# Patient Record
Sex: Male | Born: 2000 | Race: Black or African American | Hispanic: No | Marital: Single | Smoking: Never smoker
Health system: Southern US, Community
[De-identification: ages and names within clinical notes are randomized; demographics above are authoritative.]

## PROBLEM LIST (undated history)

## (undated) DIAGNOSIS — K219 Gastro-esophageal reflux disease without esophagitis: Secondary | ICD-10-CM

## (undated) HISTORY — DX: Gastro-esophageal reflux disease without esophagitis: K21.9

---
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
## 2018-04-18 ENCOUNTER — Emergency Department (HOSPITAL_COMMUNITY)
Admission: EM | Admit: 2018-04-18 | Discharge: 2018-04-18 | Disposition: A | Discharge status: Home / Self Care | Payer: Self-pay | Attending: Emergency Medicine | Admitting: Emergency Medicine

## 2018-04-18 ENCOUNTER — Encounter (HOSPITAL_COMMUNITY): Payer: Self-pay | Admitting: Emergency Medicine

## 2018-04-18 DIAGNOSIS — R369 Urethral discharge, unspecified: Secondary | ICD-10-CM | POA: Insufficient documentation

## 2018-04-18 LAB — URINALYSIS, ROUTINE W REFLEX MICROSCOPIC
BILIRUBIN URINE: NEGATIVE
Glucose, UA: NEGATIVE mg/dL
KETONES UR: NEGATIVE mg/dL
NITRITE: NEGATIVE
PH: 6 (ref 5.0–8.0)
PROTEIN: NEGATIVE mg/dL
Specific Gravity, Urine: 1.016 (ref 1.005–1.030)

## 2018-04-18 MED ORDER — CEFTRIAXONE SODIUM 250 MG IJ SOLR
250.0000 mg | Freq: Once | INTRAMUSCULAR | Status: AC
  Administered 2018-04-18: 250 mg via INTRAMUSCULAR
  Filled 2018-04-18: qty 250

## 2018-04-18 MED ORDER — AZITHROMYCIN 250 MG PO TABS
1000.0000 mg | ORAL_TABLET | Freq: Once | ORAL | Status: AC
  Administered 2018-04-18: 1000 mg via ORAL
  Filled 2018-04-18: qty 4

## 2018-04-18 NOTE — Discharge Instructions (Addendum)
You were treated for gonorrhea and chlamydia today.  The tests for gonorrhea, chlamydia, HIV, and syphilis were sent.  The results should return in several days.  If they are negative, you will not receive a phone call.  If they are positive, you will receive a phone call.  Either way, you should be able to see your results on MyChart. If you are positive for gonorrhea or chlamydia, you do not need further treatment.  If you are positive for HIV or syphilis, you should follow-up with the health department for further management.   If any of your results are positive, you need to inform all recent partners. Do not have unprotected sex for the next 2 weeks, as this can cause reinfection.   Return to the emergency room if you develop fevers, vomiting, abdominal pain, or any new or concerning symptoms.

## 2018-04-18 NOTE — ED Notes (Signed)
ED Provider at bedside. 

## 2018-04-18 NOTE — ED Provider Notes (Signed)
[facility] Provider Note   CSN: [id number] Arrival date & time: 04/18/18  1038     History   Chief Complaint Chief Complaint  Patient presents with  . Exposure to STD    HPI Lawrence Schneider is a 17 y.o. male presenting for evaluation of penile discharge.  Patient states that he developed penile discharge this morning.  Discharge is yellow.  He has some mild dysuria, no hematuria or urinary frequency.  He denies fevers, chills, nausea, vomiting, abdominal pain.  He has had unprotected sex in the past several weeks.  He reports to 5-6 partners over the course of his life, all male.  Additionally, patient has a sore throat that started yesterday.  He denies ear pain, nasal congestion, or cough.  He denies sick contacts.  He states none of his sexual partners have symptoms.  He has never been tested for STDs before.  He has no medical problems, takes no medications daily.  HPI  History reviewed. No pertinent past medical history.  There are no active problems to display for this patient.   History reviewed. No pertinent surgical history.      Home Medications    Prior to Admission medications   Not on File    Family History No family history on file.  Social History Social History   Tobacco Use  . Smoking status: Never Smoker  Substance Use Topics  . Alcohol use: Never    Frequency: Never  . Drug use: Never     Allergies   Patient has no known allergies.   Review of Systems Review of Systems  Constitutional: Negative for fever.  HENT: Positive for sore throat.   Gastrointestinal: Negative for nausea and vomiting.  Genitourinary: Positive for discharge and dysuria. Negative for difficulty urinating, flank pain, frequency, hematuria, penile swelling, scrotal swelling and testicular pain.     Physical Exam Updated Vital Signs BP 123/68 (BP Location: Right Arm)   Pulse 76   Temp 98.8 F (37.1 C) (Oral)   Resp 18    SpO2 100%   Physical Exam  Constitutional: He is oriented to person, place, and time. He appears well-developed and well-nourished. No distress.  Patient is in no distress  HENT:  Head: Normocephalic and atraumatic.  Right Ear: Tympanic membrane, external ear and ear canal normal.  Left Ear: Tympanic membrane, external ear and ear canal normal.  Nose: Nose normal. Right sinus exhibits no maxillary sinus tenderness and no frontal sinus tenderness. Left sinus exhibits no maxillary sinus tenderness and no frontal sinus tenderness.  Mouth/Throat: Uvula is midline and mucous membranes are normal. Posterior oropharyngeal erythema present. No oropharyngeal exudate or posterior oropharyngeal edema. Tonsils are 0 on the right. Tonsils are 0 on the left. No tonsillar exudate.  Mild erythema of OP without tonsillar swelling or exudate.  Uvula midline with equal palate rise.  TMs nonerythematous and nonbulging bilaterally.  No tenderness to palpation of the sinuses.  Eyes: Pupils are equal, round, and reactive to light. Conjunctivae and EOM are normal.  Neck: Normal range of motion.  Cardiovascular: Normal rate, regular rhythm and intact distal pulses.  Pulmonary/Chest: Effort normal and breath sounds normal. He has no decreased breath sounds. He has no wheezes. He has no rhonchi. He has no rales.  Pt speaking in full sentences without difficulty.  Clear lung sounds in all fields  Abdominal: Soft. He exhibits no distension. There is no tenderness. Hernia confirmed negative in the right inguinal area and  confirmed negative in the left inguinal area.  Genitourinary: Testes normal and penis normal. Right testis shows no mass and no tenderness. Left testis shows no mass and no tenderness. Circumcised. No penile tenderness. No discharge found.  Genitourinary Comments: Chaperone present.  No inguinal lymphadenopathy.  No tenderness of the penis or scrotum.  No obvious discharge.  Musculoskeletal: Normal range of  motion.  Lymphadenopathy:    He has no cervical adenopathy. No inguinal adenopathy noted on the right or left side.  Neurological: He is alert and oriented to person, place, and time.  Skin: Skin is warm.  Psychiatric: He has a normal mood and affect.  Nursing note and vitals reviewed.    ED Treatments / Results  Labs (all labs ordered are listed, but only abnormal results are displayed) Labs Reviewed  URINALYSIS, ROUTINE W REFLEX MICROSCOPIC - Abnormal; Notable for the following components:      Result Value   Hgb urine dipstick SMALL (*)    Leukocytes, UA MODERATE (*)    WBC, UA >50 (*)    Bacteria, UA FEW (*)    All other components within normal limits  RPR  HIV ANTIBODY (ROUTINE TESTING)  GC/CHLAMYDIA PROBE AMP ([place]) NOT AT [facility]    EKG None  Radiology No results found.  Procedures Procedures (including critical care time)  Medications Ordered in ED Medications  cefTRIAXone (ROCEPHIN) injection 250 mg (250 mg Intramuscular Given 04/18/18 1207)  azithromycin (ZITHROMAX) tablet 1,000 mg (1,000 mg Oral Given 04/18/18 1206)     Initial Impression / Assessment and Plan / ED Course  I have reviewed the triage vital signs and the nursing notes.  Pertinent labs & imaging results that were available during my care of the patient were reviewed by me and considered in my medical decision making (see chart for details).     Patient presenting for evaluation of sore throat and penile discharge.  Physical exam shows patient who is afebrile not tachycardic.  He appears nontoxic.  OP exam shows mild erythema without signs of strep throat.  No other URI symptoms. Doubt strep, pna, or aom. Likely viral.  Discussed with patient.  Additionally, patient reporting penile discharge.  Physical exam does not show any penile discharge at this time.  Patient is concerned about STDs.  Discussed option of prophylactic treatment, patient elects to get treated.  Gonorrhea, chlamydia, HIV,  and RPR sent.  Discussed importance of follow-up of HIV or RPR is positive.  Patient told to inform all social partners if tests are positive.  At this time, patient appears safe for discharge.  Return precautions given.  Patient states he understands and agrees to plan.  Final Clinical Impressions(s) / ED Diagnoses   Final diagnoses:  Penile discharge    ED Discharge Orders    None       Alveria Apley, PA-C 04/18/18 1407    Mesner, Barbara Cower, MD 04/19/18 1028

## 2018-04-18 NOTE — ED Triage Notes (Signed)
Pt reports concerned for std, c/o flu like symptoms and penile discharge and painful urination since yesterday.

## 2018-04-19 LAB — GC/CHLAMYDIA PROBE AMP (~~LOC~~) NOT AT ARMC
Chlamydia: POSITIVE — AB
Neisseria Gonorrhea: NEGATIVE

## 2018-04-19 LAB — RPR: RPR: NONREACTIVE

## 2018-04-19 LAB — HIV ANTIBODY (ROUTINE TESTING W REFLEX): HIV SCREEN 4TH GENERATION: NONREACTIVE

## 2018-08-08 ENCOUNTER — Emergency Department (HOSPITAL_COMMUNITY)
Admission: EM | Admit: 2018-08-08 | Discharge: 2018-08-08 | Disposition: A | Discharge status: Home / Self Care | Payer: Self-pay | Attending: Emergency Medicine | Admitting: Emergency Medicine

## 2018-08-08 ENCOUNTER — Encounter (HOSPITAL_COMMUNITY): Payer: Self-pay | Admitting: Emergency Medicine

## 2018-08-08 ENCOUNTER — Other Ambulatory Visit: Payer: Self-pay

## 2018-08-08 DIAGNOSIS — N342 Other urethritis: Secondary | ICD-10-CM

## 2018-08-08 LAB — RPR: RPR Ser Ql: NONREACTIVE

## 2018-08-08 LAB — HIV ANTIBODY (ROUTINE TESTING W REFLEX): HIV SCREEN 4TH GENERATION: NONREACTIVE

## 2018-08-08 MED ORDER — AZITHROMYCIN 250 MG PO TABS
1000.0000 mg | ORAL_TABLET | Freq: Once | ORAL | Status: AC
  Administered 2018-08-08: 1000 mg via ORAL
  Filled 2018-08-08: qty 4

## 2018-08-08 MED ORDER — LIDOCAINE HCL (PF) 1 % IJ SOLN
2.0000 mL | Freq: Once | INTRAMUSCULAR | Status: AC
  Administered 2018-08-08: 2 mL
  Filled 2018-08-08: qty 5

## 2018-08-08 MED ORDER — CEFTRIAXONE SODIUM 250 MG IJ SOLR
250.0000 mg | Freq: Once | INTRAMUSCULAR | Status: AC
  Administered 2018-08-08: 250 mg via INTRAMUSCULAR
  Filled 2018-08-08: qty 250

## 2018-08-08 NOTE — Discharge Instructions (Signed)
Please inform all sexual partners to be seen and evaluated at the health department

## 2018-08-08 NOTE — ED Provider Notes (Signed)
[facility] EMERGENCY DEPARTMENT Provider Note   CSN: [id number] Arrival date & time: 08/08/18  0744     History   Chief Complaint Chief Complaint  Patient presents with  . std check    HPI Lawrence Schneider is a 17 y.o. male.  HPI Patient is a 17 year old male with a history of prior x-ray transmitted infection who presents the emergency department with recurrent discharge from his penis after unprotected sex.  He denies other symptoms.  Reports the discharge is white.  Is been present for 48 hours.  No other complaints.  Symptoms are mild in severity.   History reviewed. No pertinent past medical history.  There are no active problems to display for this patient.   History reviewed. No pertinent surgical history.      Home Medications    Prior to Admission medications   Not on File    Family History History reviewed. No pertinent family history.  Social History Social History   Tobacco Use  . Smoking status: Never Smoker  . Smokeless tobacco: Never Used  Substance Use Topics  . Alcohol use: Never    Frequency: Never  . Drug use: Never     Allergies   Patient has no known allergies.   Review of Systems Review of Systems  All other systems reviewed and are negative.    Physical Exam Updated Vital Signs BP 131/81 (BP Location: Right Arm)   Pulse 72   Temp 98.3 F (36.8 C) (Oral)   Resp 18   Ht 5\' 5"  (1.651 m)   Wt 72.6 kg   SpO2 99%   BMI 26.63 kg/m   Physical Exam  Constitutional: He is oriented to person, place, and time. He appears well-developed and well-nourished.  HENT:  Head: Normocephalic.  Eyes: EOM are normal.  Neck: Normal range of motion.  Pulmonary/Chest: Effort normal.  Abdominal: He exhibits no distension.  Genitourinary:  Genitourinary Comments: Circumcised penis.  No penile tenderness or swelling.  Normal-appearing scrotum.  No frank urethral discharge at this time  Musculoskeletal: Normal range of  motion.  Neurological: He is alert and oriented to person, place, and time.  Psychiatric: He has a normal mood and affect.  Nursing note and vitals reviewed.    ED Treatments / Results  Labs (all labs ordered are listed, but only abnormal results are displayed) Labs Reviewed  HIV ANTIBODY (ROUTINE TESTING W REFLEX)  RPR  GC/CHLAMYDIA PROBE AMP ([place]) NOT AT [facility]    EKG None  Radiology No results found.  Procedures Procedures (including critical care time)  Medications Ordered in ED Medications  cefTRIAXone (ROCEPHIN) injection 250 mg (250 mg Intramuscular Given 08/08/18 0911)  azithromycin (ZITHROMAX) tablet 1,000 mg (1,000 mg Oral Given 08/08/18 0911)  lidocaine (PF) (XYLOCAINE) 1 % injection 2 mL (2 mLs Other Given 08/08/18 0911)     Initial Impression / Assessment and Plan / ED Course  I have reviewed the triage vital signs and the nursing notes.  Pertinent labs & imaging results that were available during my care of the patient were reviewed by me and considered in my medical decision making (see chart for details).     Patient be treated for GC chlamydia.  HIV and RPR sent.  Patient informed to let all sexual partners know about the possibility of an STI and to be seen and evaluated at the health department  Final Clinical Impressions(s) / ED Diagnoses   Final diagnoses:  None    ED  Discharge Orders    None       Azalia Bilis, MD 08/08/18 [phone]

## 2018-08-08 NOTE — ED Triage Notes (Signed)
C/o penile discharge started 2 days ago,  -with burning on urination.

## 2018-08-09 LAB — GC/CHLAMYDIA PROBE AMP (~~LOC~~) NOT AT ARMC
CHLAMYDIA, DNA PROBE: NEGATIVE
NEISSERIA GONORRHEA: NEGATIVE

## 2020-08-19 ENCOUNTER — Emergency Department (HOSPITAL_COMMUNITY)
Admission: EM | Admit: 2020-08-19 | Discharge: 2020-08-19 | Disposition: A | Discharge status: Home / Self Care | Payer: Self-pay | Attending: Emergency Medicine | Admitting: Emergency Medicine

## 2020-08-19 ENCOUNTER — Other Ambulatory Visit: Payer: Self-pay

## 2020-08-19 ENCOUNTER — Emergency Department (HOSPITAL_COMMUNITY): Payer: Self-pay

## 2020-08-19 ENCOUNTER — Encounter (HOSPITAL_COMMUNITY): Payer: Self-pay | Admitting: Emergency Medicine

## 2020-08-19 DIAGNOSIS — K219 Gastro-esophageal reflux disease without esophagitis: Secondary | ICD-10-CM | POA: Insufficient documentation

## 2020-08-19 LAB — URINALYSIS, ROUTINE W REFLEX MICROSCOPIC
Bacteria, UA: NONE SEEN
Bilirubin Urine: NEGATIVE
Glucose, UA: NEGATIVE mg/dL
Ketones, ur: NEGATIVE mg/dL
Nitrite: NEGATIVE
Protein, ur: NEGATIVE mg/dL
Specific Gravity, Urine: 1.017 (ref 1.005–1.030)
pH: 6 (ref 5.0–8.0)

## 2020-08-19 LAB — COMPREHENSIVE METABOLIC PANEL
ALT: 16 U/L (ref 0–44)
AST: 23 U/L (ref 15–41)
Albumin: 4.4 g/dL (ref 3.5–5.0)
Alkaline Phosphatase: 59 U/L (ref 38–126)
Anion gap: 12 (ref 5–15)
BUN: 17 mg/dL (ref 6–20)
CO2: 25 mmol/L (ref 22–32)
Calcium: 9.3 mg/dL (ref 8.9–10.3)
Chloride: 100 mmol/L (ref 98–111)
Creatinine, Ser: 1.26 mg/dL — ABNORMAL HIGH (ref 0.61–1.24)
GFR, Estimated: >60 mL/min (ref >60)
Glucose, Bld: 107 mg/dL — ABNORMAL HIGH (ref 70–99)
Potassium: 3.2 mmol/L — ABNORMAL LOW (ref 3.5–5.1)
Sodium: 137 mmol/L (ref 135–145)
Total Bilirubin: 0.6 mg/dL (ref 0.3–1.2)
Total Protein: 7.6 g/dL (ref 6.5–8.1)

## 2020-08-19 LAB — CBC
HCT: 44.9 % (ref 39.0–52.0)
Hemoglobin: 16.2 g/dL (ref 13.0–17.0)
MCH: 31.6 pg (ref 26.0–34.0)
MCHC: 36.1 g/dL — ABNORMAL HIGH (ref 30.0–36.0)
MCV: 87.5 fL (ref 80.0–100.0)
Platelets: 207 10*3/uL (ref 150–400)
RBC: 5.13 MIL/uL (ref 4.22–5.81)
RDW: 11.3 % — ABNORMAL LOW (ref 11.5–15.5)
WBC: 6.8 10*3/uL (ref 4.0–10.5)
nRBC: 0 % (ref 0.0–0.2)

## 2020-08-19 LAB — TROPONIN I (HIGH SENSITIVITY)
Troponin I (High Sensitivity): <2 ng/L (ref <18)
Troponin I (High Sensitivity): <2 ng/L (ref <18)

## 2020-08-19 LAB — LIPASE, BLOOD: Lipase: 24 U/L (ref 11–51)

## 2020-08-19 MED ORDER — OMEPRAZOLE 20 MG PO CPDR: 20.0000 mg | DELAYED_RELEASE_CAPSULE | Freq: Every day | ORAL | 0 refills | Status: DC

## 2020-08-19 MED ORDER — ALUM & MAG HYDROXIDE-SIMETH 200-200-20 MG/5ML PO SUSP
30.0000 mL | Freq: Once | ORAL | Status: DC
  Filled 2020-08-19: qty 30

## 2020-08-19 NOTE — ED Triage Notes (Signed)
Pt reports right sided abdominal/chest pain that started when last night.  Pt indicated that pain gets worse when he eats.

## 2020-08-19 NOTE — ED Notes (Signed)
Patient verbalized understanding of dc instructions, vss, ambulatory with nad.   

## 2020-08-19 NOTE — ED Notes (Signed)
Patient transported to Ultrasound 

## 2020-08-19 NOTE — ED Provider Notes (Signed)
[facility] EMERGENCY DEPARTMENT Provider Note   CSN: [id number] Arrival date & time: 08/19/20  0018     History Chief Complaint  Patient presents with  . Abdominal Pain  . Chest Pain    Lawrence Schneider is a 19 y.o. male.  The history is provided by the patient.  Abdominal Pain Pain location:  Epigastric Pain quality: cramping   Pain radiates to:  Does not radiate Pain severity:  Moderate Onset quality:  Gradual Timing:  Constant Progression:  Unchanged Chronicity:  New Context: eating   Context: not alcohol use   Context comment:  Greasy and spicy food.  Relieved by:  Nothing Worsened by:  Nothing Ineffective treatments:  None tried Associated symptoms: chest pain   Associated symptoms: no fever and no shortness of breath   Risk factors: no alcohol abuse and no aspirin use   Patient with epigastric pain that radiates to the chest with meals.  No f/c/r.  No pleuritic pain. No leg pain no travel.  No exertional symptoms.       History reviewed. No pertinent past medical history.  There are no problems to display for this patient.   History reviewed. No pertinent surgical history.     History reviewed. No pertinent family history.  Social History   Tobacco Use  . Smoking status: Never Smoker  . Smokeless tobacco: Never Used  Vaping Use  . Vaping Use: Every day  Substance Use Topics  . Alcohol use: Never  . Drug use: Never    Home Medications Prior to Admission medications   Medication Sig Start Date End Date Taking? Authorizing Provider  omeprazole (PRILOSEC) 20 MG capsule Take 1 capsule (20 mg total) by mouth daily. 08/19/20   Roshan Roback, MD    Allergies    Patient has no known allergies.  Review of Systems   Review of Systems  Constitutional: Negative for fever.  HENT: Negative for congestion.   Eyes: Negative for visual disturbance.  Respiratory: Negative for shortness of breath.   Cardiovascular: Positive for chest  pain. Negative for palpitations and leg swelling.  Gastrointestinal: Positive for abdominal pain.  Genitourinary: Negative for difficulty urinating.  Musculoskeletal: Negative for arthralgias.  Neurological: Negative for dizziness.  Psychiatric/Behavioral: Negative for agitation.  All other systems reviewed and are negative.   Physical Exam Updated Vital Signs BP 124/87   Pulse 98   Temp 98.2 F (36.8 C) (Oral)   Resp 15   SpO2 99%   Physical Exam Vitals and nursing note reviewed.  Constitutional:      Appearance: Normal appearance. He is not ill-appearing or diaphoretic.  HENT:     Head: Normocephalic and atraumatic.     Nose: Nose normal.  Eyes:     Conjunctiva/sclera: Conjunctivae normal.     Pupils: Pupils are equal, round, and reactive to light.  Cardiovascular:     Rate and Rhythm: Normal rate and regular rhythm.     Pulses: Normal pulses.     Heart sounds: Normal heart sounds.  Pulmonary:     Effort: Pulmonary effort is normal.     Breath sounds: Normal breath sounds.  Abdominal:     General: Abdomen is flat. Bowel sounds are normal.     Palpations: Abdomen is soft.     Tenderness: There is no abdominal tenderness. There is no right CVA tenderness or guarding.  Musculoskeletal:        General: Normal range of motion.     Cervical back: Normal  range of motion and neck supple.  Skin:    General: Skin is warm and dry.     Capillary Refill: Capillary refill takes less than 2 seconds.  Neurological:     General: No focal deficit present.     Mental Status: He is alert and oriented to person, place, and time.     Deep Tendon Reflexes: Reflexes normal.  Psychiatric:        Mood and Affect: Mood normal.        Behavior: Behavior normal.     ED Results / Procedures / Treatments   Labs (all labs ordered are listed, but only abnormal results are displayed) Results for orders placed or performed during the hospital encounter of 08/19/20  Lipase, blood  Result  Value Ref Range   Lipase 24 11 - 51 U/L  Comprehensive metabolic panel  Result Value Ref Range   Sodium 137 135 - 145 mmol/L   Potassium 3.2 (L) 3.5 - 5.1 mmol/L   Chloride 100 98 - 111 mmol/L   CO2 25 22 - 32 mmol/L   Glucose, Bld 107 (H) 70 - 99 mg/dL   BUN 17 6 - 20 mg/dL   Creatinine, Ser 1.611.26 (H) 0.61 - 1.24 mg/dL   Calcium 9.3 8.9 - 09.610.3 mg/dL   Total Protein 7.6 6.5 - 8.1 g/dL   Albumin 4.4 3.5 - 5.0 g/dL   AST 23 15 - 41 U/L   ALT 16 0 - 44 U/L   Alkaline Phosphatase 59 38 - 126 U/L   Total Bilirubin 0.6 0.3 - 1.2 mg/dL   GFR, Estimated >04>60 >54>60 mL/min   Anion gap 12 5 - 15  CBC  Result Value Ref Range   WBC 6.8 4.0 - 10.5 K/uL   RBC 5.13 4.22 - 5.81 MIL/uL   Hemoglobin 16.2 13.0 - 17.0 g/dL   HCT 09.844.9 39 - 52 %   MCV 87.5 80.0 - 100.0 fL   MCH 31.6 26.0 - 34.0 pg   MCHC 36.1 (H) 30.0 - 36.0 g/dL   RDW 11.911.3 (L) 14.711.5 - 82.915.5 %   Platelets 207 150 - 400 K/uL   nRBC 0.0 0.0 - 0.2 %  Urinalysis, Routine w reflex microscopic Urine, Clean Catch  Result Value Ref Range   Color, Urine YELLOW YELLOW   APPearance CLEAR CLEAR   Specific Gravity, Urine 1.017 1.005 - 1.030   pH 6.0 5.0 - 8.0   Glucose, UA NEGATIVE NEGATIVE mg/dL   Hgb urine dipstick SMALL (A) NEGATIVE   Bilirubin Urine NEGATIVE NEGATIVE   Ketones, ur NEGATIVE NEGATIVE mg/dL   Protein, ur NEGATIVE NEGATIVE mg/dL   Nitrite NEGATIVE NEGATIVE   Leukocytes,Ua TRACE (A) NEGATIVE   RBC / HPF 0-5 0 - 5 RBC/hpf   WBC, UA 0-5 0 - 5 WBC/hpf   Bacteria, UA NONE SEEN NONE SEEN   Mucus PRESENT   Troponin I (High Sensitivity)  Result Value Ref Range   Troponin I (High Sensitivity) <2 <18 ng/L  Troponin I (High Sensitivity)  Result Value Ref Range   Troponin I (High Sensitivity) <2 <18 ng/L   DG Chest 2 View  Result Date: 08/19/2020 CLINICAL DATA:  Chest pain. EXAM: CHEST - 2 VIEW COMPARISON:  None. FINDINGS: The heart size and mediastinal contours are within normal limits. Both lungs are clear. The visualized  skeletal structures are unremarkable. IMPRESSION: No active cardiopulmonary disease. Electronically Signed   By: Aram Candelahaddeus  Houston M.D.   On: 08/19/2020 00:52   [place]  Abdomen Limited  Result Date: 08/19/2020 CLINICAL DATA:  Right upper quadrant pain since yesterday. EXAM: ULTRASOUND ABDOMEN LIMITED RIGHT UPPER QUADRANT COMPARISON:  None. FINDINGS: Gallbladder: No gallstones or wall thickening visualized. No sonographic Murphy sign noted by sonographer. Common bile duct: Diameter: 2 mm, normal Liver: No focal lesion identified. Within normal limits in parenchymal echogenicity. Portal vein is patent on color Doppler imaging with normal direction of blood flow towards the liver. Other: None. IMPRESSION: Normal examination. Electronically Signed   By: Burman Nieves M.D.   On: 08/19/2020 04:44  ' EKG EKG Interpretation  Date/Time:  Wednesday August 19 2020 00:19:51 EDT Ventricular Rate:  84 PR Interval:  128 QRS Duration: 76 QT Interval:  358 QTC Calculation: 423 R Axis:   71 Text Interpretation: Normal sinus rhythm with sinus arrhythmia Confirmed by Yasir Kitner ([id number]) on 08/19/2020 4:03:23 AM   Radiology DG Chest 2 View  Result Date: 08/19/2020 CLINICAL DATA:  Chest pain. EXAM: CHEST - 2 VIEW COMPARISON:  None. FINDINGS: The heart size and mediastinal contours are within normal limits. Both lungs are clear. The visualized skeletal structures are unremarkable. IMPRESSION: No active cardiopulmonary disease. Electronically Signed   By: Aram Candela M.D.   On: 08/19/2020 00:52   US Abdomen Limited  Result Date: 08/19/2020 CLINICAL DATA:  Right upper quadrant pain since yesterday. EXAM: ULTRASOUND ABDOMEN LIMITED RIGHT UPPER QUADRANT COMPARISON:  None. FINDINGS: Gallbladder: No gallstones or wall thickening visualized. No sonographic Murphy sign noted by sonographer. Common bile duct: Diameter: 2 mm, normal Liver: No focal lesion identified. Within normal limits in parenchymal  echogenicity. Portal vein is patent on color Doppler imaging with normal direction of blood flow towards the liver. Other: None. IMPRESSION: Normal examination. Electronically Signed   By: Burman Nieves M.D.   On: 08/19/2020 04:44    Procedures Procedures (including critical care time)  Medications Ordered in ED Medications  alum & mag hydroxide-simeth (MAALOX/MYLANTA) 200-200-20 MG/5ML suspension 30 mL (0 mLs Oral Hold 08/19/20 0503)    ED Course  I have reviewed the triage vital signs and the nursing notes.  Pertinent labs & imaging results that were available during my care of the patient were reviewed by me and considered in my medical decision making (see chart for details).    Ruled out for Mi and biliary colic in the ED.  Heart score is 1 very low risk for MACE.  Symptoms consistent with GERD.    Kevron Patella was evaluated in Emergency Department on 08/19/2020 for the symptoms described in the history of present illness. He was evaluated in the context of the global COVID-19 pandemic, which necessitated consideration that the patient might be at risk for infection with the SARS-CoV-2 virus that causes COVID-19. Institutional protocols and algorithms that pertain to the evaluation of patients at risk for COVID-19 are in a state of rapid change based on information released by regulatory bodies including the CDC and federal and state organizations. These policies and algorithms were followed during the patient's care in the ED. \ Final Clinical Impression(s) / ED Diagnoses Final diagnoses:  Gastroesophageal reflux disease without esophagitis   Return for intractable cough, coughing up blood,fevers >100.4 unrelieved by medication, shortness of breath, intractable vomiting, chest pain, shortness of breath, weakness,numbness, changes in speech, facial asymmetry,abdominal pain, passing out,Inability to tolerate liquids or food, cough, altered mental status or any concerns. No signs  of systemic illness or infection. The patient is nontoxic-appearing on exam and vital signs are within normal limits.  I have reviewed the triage vital signs and the nursing notes. Pertinent labs &imaging results that were available during my care of the patient were reviewed by me and considered in my medical decision making (see chart for details).After history, exam, and medical workup I feel the patient has beenappropriately medically screened and is safe for discharge home. Pertinent diagnoses were discussed with the patient. Patient was given return precautions.    Rx / DC Orders ED Discharge Orders         Ordered    omeprazole (PRILOSEC) 20 MG capsule  Daily        08/19/20 0636           Fines Kimberlin, MD 08/19/20 6945

## 2020-09-15 ENCOUNTER — Telehealth: Payer: Medicaid Other | Admitting: Emergency Medicine

## 2020-09-15 DIAGNOSIS — R300 Dysuria: Secondary | ICD-10-CM

## 2020-09-15 NOTE — Progress Notes (Signed)
Based on what you shared with me, I feel your condition warrants further evaluation and I recommend that you be seen for a face to face office visit.  I'm sorry, your symptoms are consistent with "male UTI," but this diagnosis is outside of the scope of practice for an e-visit.  As such, I am not permitted to treat or evaluate you for this condition.  You will need to be seen by your primary care provider or come into the urgent care where formal testing can be performed.  I'm sorry for any inconvenience.  You will not be charged for this visit.   NOTE: If you entered your credit card information for this eVisit, you will not be charged. You may see a "hold" on your card for the $35 but that hold will drop off and you will not have a charge processed.   If you are having a true medical emergency please call 911.      For an urgent face to face visit,  has five urgent care centers for your convenience:     [facility] at [facility] Directions [phone] [address] [place], [place] [address]  10 am - 6pm Monday - Friday    [facility]) Get Driving Directions [phone] [address] [place], [place] [address]  10 am to 8 pm Monday-Friday  12 pm to 8 pm [facility] at [facility] Get Driving Directions [phone] [address], [address] [place], [place] [address]  8 am to 8 pm Monday-Friday  9 am to 6 pm Saturday  11 am to 6 pm Sunday     [facility] at [facility] Get Driving Directions  [phone] [address] [place], [place] [address]  8 am to 8 pm Monday-Friday  8 am to 4 pm [facility] Urgent Care at [facility] Directions [phone] [address] [place], [place] [address]  12 pm to 6 pm Monday-Friday      Your e-visit answers were reviewed by a  board certified advanced clinical practitioner to complete your personal care plan.  Thank you for using e-Visits.    Approximately 5 minutes was used in reviewing the patient's chart, questionnaire, prescribing medications, and documentation.

## 2020-10-04 ENCOUNTER — Other Ambulatory Visit: Payer: Self-pay

## 2020-10-04 ENCOUNTER — Emergency Department
Admission: RE | Admit: 2020-10-04 | Discharge: 2020-10-04 | Disposition: A | Discharge status: Home / Self Care | Payer: Self-pay | Source: Ambulatory Visit

## 2020-10-04 VITALS — BP 129/72 | HR 60 | Temp 98.2°F | Resp 14

## 2020-10-04 DIAGNOSIS — N342 Other urethritis: Secondary | ICD-10-CM

## 2020-10-04 DIAGNOSIS — Z113 Encounter for screening for infections with a predominantly sexual mode of transmission: Secondary | ICD-10-CM

## 2020-10-04 LAB — POCT URINALYSIS DIP (MANUAL ENTRY)
Bilirubin, UA: NEGATIVE
Glucose, UA: NEGATIVE mg/dL
Ketones, POC UA: NEGATIVE mg/dL
Nitrite, UA: NEGATIVE
Protein Ur, POC: NEGATIVE mg/dL
Spec Grav, UA: 1.025 (ref 1.010–1.025)
Urobilinogen, UA: 0.2 E.U./dL
pH, UA: 5.5 (ref 5.0–8.0)

## 2020-10-04 MED ORDER — CEFTRIAXONE SODIUM 500 MG IJ SOLR
500.0000 mg | Freq: Once | INTRAMUSCULAR | Status: AC
  Administered 2020-10-04: 500 mg via INTRAMUSCULAR

## 2020-10-04 NOTE — ED Triage Notes (Signed)
Patient presents to Urgent Care with complaints of burning and tingling w/ urination since about 10 days ago. Patient reports he has been having unprotected sex, unknown exposure to STDs.

## 2020-10-04 NOTE — ED Provider Notes (Signed)
[facility]    CSN: [id number] Arrival date & time: 10/04/20  7494      History   Chief Complaint Chief Complaint  Patient presents with  . Appointment    0900  . Dysuria    HPI Lawrence Schneider is a 19 y.o. male.   HPI  Patient here today for STD testing.  Patient endorses burning and tingling with urination x10 days ago.  He denies any penile discharge.  He has been actively having sexual intercourse without barrier protection.  He denies any knowledge of any known STD exposures.  Denies any other symptoms or concerns.  History reviewed. No pertinent past medical history.  There are no problems to display for this patient.   History reviewed. No pertinent surgical history.     Home Medications    Prior to Admission medications   Medication Sig Start Date End Date Taking? Authorizing Provider  omeprazole (PRILOSEC) 20 MG capsule Take 1 capsule (20 mg total) by mouth daily. 08/19/20   Palumbo, April, MD    Family History Family History  Problem Relation Age of Onset  . Hypertension Mother   . Healthy Father     Social History Social History   Tobacco Use  . Smoking status: Never Smoker  . Smokeless tobacco: Never Used  Vaping Use  . Vaping Use: Every day  Substance Use Topics  . Alcohol use: Yes    Comment: from time to ttime  . Drug use: Never     Allergies   Patient has no known allergies.   Review of Systems Review of Systems Pertinent negatives listed in HPI  Physical Exam Triage Vital Signs ED Triage Vitals  Enc Vitals Group     BP 10/04/20 0909 129/72     Pulse Rate 10/04/20 0909 60     Resp 10/04/20 0909 14     Temp 10/04/20 0909 98.2 F (36.8 C)     Temp Source 10/04/20 0909 Oral     SpO2 10/04/20 0909 100 %     Weight --      Height --      Head Circumference --      Peak Flow --      Pain Score 10/04/20 0907 3     Pain Loc --      Pain Edu? --      Excl. in GC? --    No data found.  Updated Vital Signs BP  129/72 (BP Location: Right Arm)   Pulse 60   Temp 98.2 F (36.8 C) (Oral)   Resp 14   SpO2 100%   Visual Acuity Right Eye Distance:   Left Eye Distance:   Bilateral Distance:    Right Eye Near:   Left Eye Near:    Bilateral Near:     Physical Exam General appearance: alert, well developed, well nourished, cooperative and in no distress Head: Normocephalic, without obvious abnormality, atraumatic Respiratory: Respirations even and unlabored, normal respiratory rate Heart: rate and rhythm normal. No gallop or murmurs noted on exam  Abdomen: BS +, no distention, no rebound tenderness, or no mass Extremities: No gross deformities Skin: Skin color, texture, turgor normal. No rashes seen  Psych: Appropriate mood and affect. Specimen self collected GU exam deferred  UC Treatments / Results  Labs (all labs ordered are listed, but only abnormal results are displayed) Labs Reviewed  POCT URINALYSIS DIP (MANUAL ENTRY) - Abnormal; Notable for the following components:      Result Value  Clarity, UA cloudy (*)    Blood, UA trace-intact (*)    Leukocytes, UA Small (1+) (*)    All other components within normal limits  C. TRACHOMATIS/N. GONORRHOEAE RNA  HIV ANTIBODY (ROUTINE TESTING W REFLEX)  RPR    EKG   Radiology No results found.  Procedures Procedures (including critical care time)  Medications Ordered in UC Medications - No data to display  Initial Impression / Assessment and Plan / UC Course  I have reviewed the triage vital signs and the nursing notes.  Pertinent labs & imaging results that were available during my care of the patient were reviewed by me and considered in my medical decision making (see chart for details).    STD concern, will treat with Rocephin 500 mg given dysuria symptoms this is likely related urethritis secondary to a possible STD.  Will wait for results before treatin with any additional oral medications. Final Clinical Impressions(s) /  UC Diagnoses   Final diagnoses:  Screening for STD (sexually transmitted disease)  Urethritis     Discharge Instructions     Your STD results will be available within the next 3 to 4 days and will be uploaded to MyChart.  In the event you require further treatment our office will notify you via MyChart.    ED Prescriptions    None     PDMP not reviewed this encounter.   Bing Neighbors, FNP 10/04/20 0930

## 2020-10-04 NOTE — Discharge Instructions (Signed)
Your STD results will be available within the next 3 to 4 days and will be uploaded to MyChart.  In the event you require further treatment our office will notify you via MyChart.

## 2020-10-06 ENCOUNTER — Telehealth (HOSPITAL_COMMUNITY): Payer: Self-pay | Admitting: Emergency Medicine

## 2020-10-06 LAB — RPR: RPR Ser Ql: NONREACTIVE

## 2020-10-06 LAB — C. TRACHOMATIS/N. GONORRHOEAE RNA
C. trachomatis RNA, TMA: DETECTED — AB
N. gonorrhoeae RNA, TMA: NOT DETECTED

## 2020-10-06 LAB — HIV ANTIBODY (ROUTINE TESTING W REFLEX): HIV 1&2 Ab, 4th Generation: NONREACTIVE

## 2020-10-06 MED ORDER — DOXYCYCLINE HYCLATE 100 MG PO CAPS: 100.0000 mg | ORAL_CAPSULE | Freq: Two times a day (BID) | ORAL | 0 refills | Status: DC

## 2020-10-08 ENCOUNTER — Telehealth: Payer: Self-pay | Admitting: Emergency Medicine

## 2020-10-08 NOTE — Telephone Encounter (Signed)
Call from [facility] regarding dose times for antibiotic - pt says he is supposed to take them every 12 hours, but he works 2nd shift and is not sure when to take the antibiotic. Snyder states he took antibiotic prior to calling [facility]. Pt instructed by RN to take medicine as prescribed for it to be effective. He had approx 1 hour on either side of the time dose was due to take the antibiotic. Pt asked about drinking alcohol while on antibiotic- pt instructed to not drink any alcohol while on antibiotics- pt verbalized an understnading

## 2020-11-25 ENCOUNTER — Encounter: Payer: Self-pay | Admitting: Nurse Practitioner

## 2020-11-25 ENCOUNTER — Other Ambulatory Visit: Payer: Self-pay

## 2020-11-25 ENCOUNTER — Ambulatory Visit (INDEPENDENT_AMBULATORY_CARE_PROVIDER_SITE_OTHER): Payer: Self-pay | Admitting: Nurse Practitioner

## 2020-11-25 VITALS — BP 118/76 | HR 60 | Temp 97.6°F | Ht 65.0 in | Wt 142.2 lb

## 2020-11-25 DIAGNOSIS — K219 Gastro-esophageal reflux disease without esophagitis: Secondary | ICD-10-CM

## 2020-11-25 DIAGNOSIS — Z1159 Encounter for screening for other viral diseases: Secondary | ICD-10-CM

## 2020-11-25 DIAGNOSIS — Z8619 Personal history of other infectious and parasitic diseases: Secondary | ICD-10-CM

## 2020-11-25 DIAGNOSIS — Z7689 Persons encountering health services in other specified circumstances: Secondary | ICD-10-CM

## 2020-11-25 MED ORDER — OMEPRAZOLE 20 MG PO CPDR: 20.0000 mg | DELAYED_RELEASE_CAPSULE | Freq: Every day | ORAL | 2 refills | Status: DC

## 2020-11-25 NOTE — Patient Instructions (Signed)
Healthy Eating Following a healthy eating pattern may help you to achieve and maintain a healthy body weight, reduce the risk of chronic disease, and live a long and productive life. It is important to follow a healthy eating pattern at an appropriate calorie level for your body. Your nutritional needs should be met primarily through food by choosing a variety of nutrient-rich foods. What are tips for following this plan? Reading food labels  Read labels and choose the following: ? Reduced or low sodium. ? Juices with 100% fruit juice. ? Foods with low saturated fats and high polyunsaturated and monounsaturated fats. ? Foods with whole grains, such as whole wheat, cracked wheat, brown rice, and wild rice. ? Whole grains that are fortified with folic acid. This is recommended for women who are pregnant or who want to become pregnant.  Read labels and avoid the following: ? Foods with a lot of added sugars. These include foods that contain brown sugar, corn sweetener, corn syrup, dextrose, fructose, glucose, high-fructose corn syrup, honey, invert sugar, lactose, malt syrup, maltose, molasses, raw sugar, sucrose, trehalose, or turbinado sugar.  Do not eat more than the following amounts of added sugar per day:  6 teaspoons (25 g) for women.  9 teaspoons (38 g) for men. ? Foods that contain processed or refined starches and grains. ? Refined grain products, such as white flour, degermed cornmeal, white bread, and white rice. Shopping  Choose nutrient-rich snacks, such as vegetables, whole fruits, and nuts. Avoid high-calorie and high-sugar snacks, such as potato chips, fruit snacks, and candy.  Use oil-based dressings and spreads on foods instead of solid fats such as butter, stick margarine, or cream cheese.  Limit pre-made sauces, mixes, and "instant" products such as flavored rice, instant noodles, and ready-made pasta.  Try more plant-protein sources, such as tofu, tempeh, black beans,  edamame, lentils, nuts, and seeds.  Explore eating plans such as the Mediterranean diet or vegetarian diet. Cooking  Use oil to saut or stir-fry foods instead of solid fats such as butter, stick margarine, or lard.  Try baking, boiling, grilling, or broiling instead of frying.  Remove the fatty part of meats before cooking.  Steam vegetables in water or broth. Meal planning  At meals, imagine dividing your plate into fourths: ? One-half of your plate is fruits and vegetables. ? One-fourth of your plate is whole grains. ? One-fourth of your plate is protein, especially lean meats, poultry, eggs, tofu, beans, or nuts.  Include low-fat dairy as part of your daily diet.   Lifestyle  Choose healthy options in all settings, including home, work, school, restaurants, or stores.  Prepare your food safely: ? Wash your hands after handling raw meats. ? Keep food preparation surfaces clean by regularly washing with hot, soapy water. ? Keep raw meats separate from ready-to-eat foods, such as fruits and vegetables. ? Cook seafood, meat, poultry, and eggs to the recommended internal temperature. ? Store foods at safe temperatures. In general:  Keep cold foods at 7F (4.4C) or below.  Keep hot foods at 17F (60C) or above.  Keep your freezer at [facility] (-17.8C) or below.  Foods are no longer safe to eat when they have been between the temperatures of 40-17F (4.4-60C) for more than 2 hours. What foods should I eat? Fruits Aim to eat 2 cup-equivalents of fresh, canned (in natural juice), or frozen fruits each day. Examples of 1 cup-equivalent of fruit include 1 small apple, 8 large strawberries, 1 cup canned fruit,  cup dried fruit, or 1 cup 100% juice. Vegetables Aim to eat 2-3 cup-equivalents of fresh and frozen vegetables each day, including different varieties and colors. Examples of 1 cup-equivalent of vegetables include 2 medium carrots, 2 cups raw, leafy greens, 1 cup chopped  vegetable (raw or cooked), or 1 medium baked potato. Grains Aim to eat 6 ounce-equivalents of whole grains each day. Examples of 1 ounce-equivalent of grains include 1 slice of bread, 1 cup ready-to-eat cereal, 3 cups popcorn, or  cup cooked rice, pasta, or cereal. Meats and other proteins Aim to eat 5-6 ounce-equivalents of protein each day. Examples of 1 ounce-equivalent of protein include 1 egg, 1/2 cup nuts or seeds, or 1 tablespoon (16 g) peanut butter. A cut of meat or fish that is the size of a deck of cards is about 3-4 ounce-equivalents.  Of the protein you eat each week, try to have at least 8 ounces come from seafood. This includes salmon, trout, herring, and anchovies. Dairy Aim to eat 3 cup-equivalents of fat-free or low-fat dairy each day. Examples of 1 cup-equivalent of dairy include 1 cup (240 mL) milk, 8 ounces (250 g) yogurt, 1 ounces (44 g) natural cheese, or 1 cup (240 mL) fortified soy milk. Fats and oils  Aim for about 5 teaspoons (21 g) per day. Choose monounsaturated fats, such as canola and olive oils, avocados, peanut butter, and most nuts, or polyunsaturated fats, such as sunflower, corn, and soybean oils, walnuts, pine nuts, sesame seeds, sunflower seeds, and flaxseed. Beverages  Aim for six 8-oz glasses of water per day. Limit coffee to three to five 8-oz cups per day.  Limit caffeinated beverages that have added calories, such as soda and energy drinks.  Limit alcohol intake to no more than 1 drink a day for nonpregnant women and 2 drinks a day for men. One drink equals 12 oz of beer (355 mL), 5 oz of wine (148 mL), or 1 oz of hard liquor (44 mL). Seasoning and other foods  Avoid adding excess amounts of salt to your foods. Try flavoring foods with herbs and spices instead of salt.  Avoid adding sugar to foods.  Try using oil-based dressings, sauces, and spreads instead of solid fats. This information is based on general [place] nutrition guidelines. For more  information, visit [employer]. Exact amounts may vary based on your nutrition needs. Summary  A healthy eating plan may help you to maintain a healthy weight, reduce the risk of chronic diseases, and stay active throughout your life.  Plan your meals. Make sure you eat the right portions of a variety of nutrient-rich foods.  Try baking, boiling, grilling, or broiling instead of frying.  Choose healthy options in all settings, including home, work, school, restaurants, or stores. This information is not intended to replace advice given to you by your health care provider. Make sure you discuss any questions you have with your health care provider. Document Revised: 01/29/2018 Document Reviewed: 01/29/2018 Elsevier Patient Education  2021 [place] that are in packages or containers have a Nutrition Facts panel on the side or back. This is commonly called the food label. The food label helps you make informed food choices by providing information about serving size and the amount of calories and various nutrients in the food. You can check the food label to find out if the food contains high or low amounts of items that you want to limit in your diet. You can  also use the food label to see if the food is a good source of the nutrients that you want to include in your diet. How do I read the food label?  Start by looking at the serving size and servings per package.  Check the calories.  Check the amount of fat, cholesterol, and sodium. Try to limit these nutrients.  Check the amount of dietary fiber, protein, and other vitamins and minerals listed. Depending on recommendations from your health care provider or dietitian, certain values may be more important to your overall health and diet than others.  Check the added sugar. This is sugar that was added in the making of the food or drink. This number does not include sugar that naturally occurs in  foods such as milk, fruits, and vegetables. Try to limit added sugar. Children aged 2-18 years and women should limit added sugar to 6 teaspoons (25 g) a day. Men should limit added sugar to 9 teaspoons (36 g) a day.  Look at the ingredient list. Depending on your dietary needs, you may need to avoid foods with certain ingredients. Talk to your health care provider or dietitian about what ingredients you should watch for.   What does the information on the food label mean? Serving size  This indicates the amount of the food that makes up one serving. All of the nutrition information listed on the food label is based on one serving.  Serving size may be based on: ? The number of food pieces. ? The volume of food (cups, fluid ounces, tablespoons, milliliters). ? The weight of food (grams, ounces).  The label will also indicate how many servings are in one package. If you eat more than one serving, you must multiply the amounts (such as calories, grams of saturated fat, or milligrams of sodium) by the number of servings. Calories  Calories are a measure of the amount of energy that your body gets from the food.  Most food labels list only the calories in one serving of food. Some foods may list the number of calories per package if one package contains slightly more than one serving.  Counting total daily calories is one way that is used to help manage weight.  Talk to your health care provider or dietitian about how many calories you should eat each day. Percent daily value  Percent Daily Value (%DV) tells you what percent of the daily value for each nutrient one serving provides. The daily value is the recommended total amount of the item that you should get each day. For example, if 15% is listed next to dietary fiber, it means that one serving of the food will give you 15% of the recommended amount of fiber that you should get in a day. The daily values are based on a diet of 2,000 calories  a day. You may get more or less than 2,000 calories in your diet each day, but the %DV gives you an idea of whether the food contains a high or low amount of the listed item. ? 5% DV or less means there is a low amount of a nutrient in one serving. ? 20% DV or higher means there is a high amount of a nutrient in one serving. Total fat  Total fat shows you the number of grams (g) of fat in one serving. Two of the fats that make up a portion of the total fat are included on the label: ? Saturated fat. The food label shows  both the amount of fat in grams (g) and the percent Daily Value per serving. This type of fat increases the amount of cholesterol in your blood. If you eat 2,000 calories each day, you should eat less than 13 g of saturated fat each day. ? Trans fat. The food label shows the number of grams (g) per serving. This type of fat is the most unhealthy fat for heart health. It is recommended that people limit their intake of trans fat to as little as possible. Look for foods that have "0 g Trans Fat" on the label. Cholesterol  Cholesterol tells you the number of milligrams (mg) and the percent Daily Value of cholesterol in one serving. Cholesterol is a fat-like substance. It can be harmful if you eat too much of it. Sodium  Sodium tells you the number of milligrams (mg) and the percent Daily Value of sodium in one serving. If eaten in large amounts, sodium can raise your blood pressure. Most people should limit their sodium intake to 2,300 mg a day. Total carbohydrate  Total carbohydrate shows you the number of grams (g) of carbohydrates in one serving. Two types of carbohydrates make up the total carbohydrates included on the label: ? Dietary fiber. The food label shows both the amount of dietary fiber in grams (g) and the percent Daily Value per serving. Most adults should eat at least 25 g of dietary fiber each day. ? Total sugars. The food label shows the number of grams (g) of sugars  per serving. This value includes both naturally occurring sugars, such as those in fruit and milk, and added sugars, such as honey or table sugar.  Added sugars. This value is the amount of added sugar. It is part of the total sugar count in the food or drink. Protein  Protein tells you how many grams (g) of protein are in one serving. The recommended amount of daily protein differs for men and women, and it may depend on your overall health. Talk to your health care provider or dietitian about how much protein you should eat each day. Vitamins and minerals  The food label shows the percent Daily Value for certain vitamins and minerals, including vitamin D, calcium, potassium, and iron. Other vitamins and minerals may be listed depending on the food. Ingredients  Food labels list each ingredient in the food. The ingredients are listed in the order of their amount by weight from most to least.  Food labels may also include a warning about ingredients that can cause allergic reactions in some people. These may be indicated by the words "Contains" or "May contain." Examples of ingredients that may be listed are wheat, dairy, eggs, soy, and nuts. If a person knows that he or she is allergic to one of these ingredients, he or she will know to avoid that food. Where to find more information  U.S. Food and Drug Administration: [employer] Summary  The food label is the common term for the Nutrition Facts panel on the side or back of food packages or containers.  The food label helps you make informed food choices by providing information about serving size and the amount of calories and various nutrients in the food.  To read the food label, begin by checking the serving size and number of servings in the container. Then check the calories and the amount of each listed item. This information is not intended to replace advice given to you by your health care provider. Make sure you  discuss any  questions you have with your health care provider. Document Revised: 01/01/2020 Document Reviewed: 01/01/2020 Elsevier Patient Education  [place].

## 2020-11-25 NOTE — Progress Notes (Unsigned)
I,Yamilka Roman [employer] as a Neurosurgeon for [employer], FNP.,have documented all relevant documentation on the behalf of Arnette Felts, FNP,as directed by  Arnette Felts, FNP while in the presence of Arnette Felts, FNP. This visit occurred during the SARS-CoV-2 public health emergency.  Safety protocols were in place, including screening questions prior to the visit, additional usage of staff PPE, and extensive cleaning of exam room while observing appropriate contact time as indicated for disinfecting solutions.  Subjective:     Patient ID: Lawrence Schneider , male    DOB: [birth year] , 20 y.o.   MRN: [id number]   Chief Complaint  Patient presents with  . Establish Care  . Heartburn    Patient stated after he eats a lot he notices that he gets some chest pain.he stated when he eats spicy foods it triggers the chest pain as well.     HPI  Here to establish care, had been seeing a provider in [place] several years ago.  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  Single.  No children.  Completed 11th grade.  He is planning to go back to get his diploma.    PMH - GERD - feels like generally when he overeats.  Hx - unsure which one but thinks Chlamydia or Gonorrhea. He did take medications for chlamydia he had a discharge but this has improved.   [facility] - mother hypertension,     Past Medical History:  Diagnosis Date  . GERD (gastroesophageal reflux disease)      Family History  Problem Relation Age of Onset  . Hypertension Mother   . Healthy Father   . Hypertension Maternal Grandmother   . Healthy Paternal Grandmother   . Healthy Paternal Grandfather      Current Outpatient Medications:  .  omeprazole (PRILOSEC) 20 MG capsule, Take 1 capsule (20 mg total) by mouth daily., Disp: 30 capsule, Rfl: 2   No Known Allergies   Review of Systems  Constitutional: Negative.   HENT: Negative.   Eyes: Negative.   Respiratory: Negative.   Cardiovascular: Negative.    Gastrointestinal: Negative.   Endocrine: Negative.   Genitourinary: Negative.   Musculoskeletal: Negative.   Skin: Negative.   Neurological: Negative.   Hematological: Negative.   Psychiatric/Behavioral: Negative.      Today's Vitals   11/25/20 0956  BP: 118/76  Pulse: 60  Temp: 97.6 F (36.4 C)  TempSrc: Oral  SpO2: (!) 65%  Weight: 142 lb 3.2 oz (64.5 kg)  Height: 5\' 5"  (1.651 m)  PainSc: 0-No pain   Body mass index is 23.66 kg/m.   Objective:  Physical Exam Vitals reviewed.  Constitutional:      General: He is not in acute distress.    Appearance: Normal appearance.  Cardiovascular:     Rate and Rhythm: Normal rate and regular rhythm.     Pulses: Normal pulses.     Heart sounds: Normal heart sounds. No murmur heard.   Pulmonary:     Effort: Pulmonary effort is normal. No respiratory distress.     Breath sounds: Normal breath sounds.  Neurological:     General: No focal deficit present.     Mental Status: He is alert and oriented to person, place, and time.  Psychiatric:        Mood and Affect: Mood normal.        Behavior: Behavior normal.        Thought Content: Thought content normal.  Judgment: Judgment normal.         Assessment And Plan:     1. Gastroesophageal reflux disease without esophagitis  Will try him on omeprazole,   He is encouraged to avoid fried and fatty, spicy foods  Will follow up in 4-6 weeks - omeprazole (PRILOSEC) 20 MG capsule; Take 1 capsule (20 mg total) by mouth daily.  Dispense: 30 capsule; Refill: 2  2. History of chlamydia  3. Establishing care with new doctor, encounter for  4. Encounter for hepatitis C screening test for low risk patient  Will check Hepatitis C screening due to recent recommendations to screen all adults 18 years and older - Hepatitis C antibody     Patient was given opportunity to ask questions. Patient verbalized understanding of the plan and was able to repeat key elements of the  plan. All questions were answered to their satisfaction.  Arnette Felts, FNP   I, Arnette Felts, FNP, have reviewed all documentation for this visit. The documentation on 11/25/20 for the exam, diagnosis, procedures, and orders are all accurate and complete.   THE PATIENT IS ENCOURAGED TO PRACTICE SOCIAL DISTANCING DUE TO THE COVID-19 PANDEMIC.

## 2020-11-26 LAB — HEPATITIS C ANTIBODY: Hep C Virus Ab: <0.1 s/co ratio (ref 0.0–0.9)

## 2020-12-21 ENCOUNTER — Encounter: Payer: Self-pay | Admitting: Nurse Practitioner

## 2021-02-17 ENCOUNTER — Emergency Department (HOSPITAL_COMMUNITY)
Admission: EM | Admit: 2021-02-17 | Discharge: 2021-02-17 | Disposition: A | Discharge status: Home / Self Care | Payer: BC Managed Care – PPO | Attending: Emergency Medicine | Admitting: Emergency Medicine

## 2021-02-17 ENCOUNTER — Emergency Department (HOSPITAL_COMMUNITY): Payer: BC Managed Care – PPO

## 2021-02-17 ENCOUNTER — Other Ambulatory Visit: Payer: Self-pay

## 2021-02-17 ENCOUNTER — Encounter (HOSPITAL_COMMUNITY): Payer: Self-pay | Admitting: Emergency Medicine

## 2021-02-17 ENCOUNTER — Ambulatory Visit: Payer: Medicaid Other | Admitting: Nurse Practitioner

## 2021-02-17 DIAGNOSIS — M25531 Pain in right wrist: Secondary | ICD-10-CM | POA: Insufficient documentation

## 2021-02-17 MED ORDER — IBUPROFEN 400 MG PO TABS
600.0000 mg | ORAL_TABLET | Freq: Once | ORAL | Status: AC
  Administered 2021-02-17: 600 mg via ORAL
  Filled 2021-02-17: qty 1

## 2021-02-17 NOTE — Progress Notes (Signed)
Orthopedic Tech Progress Note Patient Details:  Lawrence Schneider 2001-09-28 [id number]  Ortho Devices Type of Ortho Device: Velcro wrist splint Ortho Device/Splint Location: RUE Ortho Device/Splint Interventions: Ordered,Application   Post Interventions Patient Tolerated: Well Instructions Provided: Care of device   [name] 02/17/2021, 4:37 PM

## 2021-02-17 NOTE — ED Triage Notes (Signed)
Pt here with right wrist pain , no trauma noted

## 2021-02-17 NOTE — ED Provider Notes (Signed)
[facility] EMERGENCY DEPARTMENT Provider Note   CSN: [id number] Arrival date & time: 02/17/21  1424     History No chief complaint on file.   Lawrence Schneider is a 20 y.o. male.  HPI He presents for evaluation of right wrist pain, present for a month, possibly related to lifting weights.  No specific known trauma.  No prior similar problems.  There are no other known active modifying factors    Past Medical History:  Diagnosis Date  . GERD (gastroesophageal reflux disease)     Patient Active Problem List   Diagnosis Date Noted  . Gastroesophageal reflux disease without esophagitis 11/25/2020    History reviewed. No pertinent surgical history.     Family History  Problem Relation Age of Onset  . Hypertension Mother   . Healthy Father   . Hypertension Maternal Grandmother   . Healthy Paternal Grandmother   . Healthy Paternal Grandfather     Social History   Tobacco Use  . Smoking status: Never Smoker  . Smokeless tobacco: Never Used  Vaping Use  . Vaping Use: Every day  Substance Use Topics  . Alcohol use: Yes    Comment: from time to ttime  . Drug use: Never    Home Medications Prior to Admission medications   Medication Sig Start Date End Date Taking? Authorizing Provider  omeprazole (PRILOSEC) 20 MG capsule Take 1 capsule (20 mg total) by mouth daily. 11/25/20   Arnette Felts, FNP    Allergies    Patient has no known allergies.  Review of Systems   Review of Systems  All other systems reviewed and are negative.   Physical Exam Updated Vital Signs BP 115/72 (BP Location: Right Arm)   Pulse 73   Temp 98.9 F (37.2 C)   Resp 16   SpO2 99%   Physical Exam Vitals and nursing note reviewed.  Constitutional:      Appearance: He is well-developed.  HENT:     Head: Normocephalic and atraumatic.     Right Ear: External ear normal.     Left Ear: External ear normal.  Eyes:     Conjunctiva/sclera: Conjunctivae normal.  Neck:      Trachea: Phonation normal.  Cardiovascular:     Rate and Rhythm: Normal rate.  Pulmonary:     Effort: Pulmonary effort is normal.  Musculoskeletal:        General: Normal range of motion.     Cervical back: Normal range of motion and neck supple.     Comments: Right wrist possibly slightly swollen.  It is tender in the area of the medial wrist.  There is no limitation of motion.  There is no deformity.  Skin:    General: Skin is warm and dry.  Neurological:     Mental Status: He is alert and oriented to person, place, and time.     Cranial Nerves: No cranial nerve deficit.     Sensory: No sensory deficit.     Motor: No abnormal muscle tone.     Coordination: Coordination normal.  Psychiatric:        Mood and Affect: Mood normal.        Behavior: Behavior normal.        Thought Content: Thought content normal.        Judgment: Judgment normal.     ED Results / Procedures / Treatments   Labs (all labs ordered are listed, but only abnormal results are displayed) Labs Reviewed -  No data to display  EKG None  Radiology DG Wrist Complete Right  Result Date: 02/17/2021 CLINICAL DATA:  20 year old male with right wrist pain. EXAM: RIGHT WRIST - COMPLETE 3+ VIEW COMPARISON:  None. FINDINGS: There is no evidence of fracture or dislocation. There is no evidence of arthropathy or other focal bone abnormality. Soft tissues are unremarkable. IMPRESSION: Negative. Electronically Signed   By: Elgie Collard M.D.   On: 02/17/2021 15:31    Procedures Procedures   Medications Ordered in ED Medications  ibuprofen (ADVIL) tablet 600 mg (600 mg Oral Given 02/17/21 1530)    ED Course  I have reviewed the triage vital signs and the nursing notes.  Pertinent labs & imaging results that were available during my care of the patient were reviewed by me and considered in my medical decision making (see chart for details).    MDM Rules/Calculators/A&P                           Patient  Vitals for the past 24 hrs:  BP Temp Pulse Resp SpO2  02/17/21 1436 115/72 98.9 F (37.2 C) 73 16 99 %    3:39 PM Reevaluation with update and discussion. After initial assessment and treatment, an updated evaluation reveals he is comfortable has no further complaints.  Findings discussed and questions answered. Mancel Bale   Medical Decision Making:  This patient is presenting for evaluation of right wrist pain, which does not require a range of treatment options, and is not a complaint that involves a high risk of morbidity and mortality. The differential diagnoses include sprain, fracture, tendinitis. I decided to review old records, and in summary Young male with subacute pain right wrist, he does a lot of lifting.  I did not require additional historical information from anyone.  Radiologic Tests Ordered, included right wrist x-ray.  I independently Visualized: Radiograph images, which show no acute abnormality   Critical Interventions-clinical evaluation, radiography, observation and reassessment  After These Interventions, the Patient was reevaluated and was found stable for discharge.  Doubt fracture, tendinitis, arthritis.  Pain is nonspecific and can be treated symptomatically.  Patient desired a wrist splint for comfort.  CRITICAL CARE-no Performed by: Mancel Bale  Nursing Notes Reviewed/ Care Coordinated Applicable Imaging Reviewed Interpretation of Laboratory Data incorporated into ED treatment  The patient appears reasonably screened and/or stabilized for discharge and I doubt any other medical condition or other [facility] requiring further screening, evaluation, or treatment in the ED at this time prior to discharge.  Plan: Home Medications-ibuprofen as needed; Home Treatments-resplinted as needed, alter strength training activities; return here if the recommended treatment, does not improve the symptoms; Recommended follow up-PCP, PRN     Final Clinical Impression(s)  / ED Diagnoses Final diagnoses:  Right wrist pain    Rx / DC Orders ED Discharge Orders    None       Mancel Bale, MD 02/17/21 1546

## 2021-02-17 NOTE — ED Notes (Signed)
Patient transported to X-ray 

## 2021-02-17 NOTE — ED Triage Notes (Signed)
Emergency Medicine Provider Triage Evaluation Note  Lawrence Schneider , a 20 y.o. male  was evaluated in triage.  Pt complains of right wrist pain.  Pain has been present and worsening over the past month.  He denies any known injury or trauma.  Does report that he works out a lot in [employer] and his prior job he was doing involves a lot of heavy lifting he does not know if he could have injured it that way.  No swelling or redness.  Pain worse with movement.  Has not used a brace or any medications to treat symptoms prior to arrival.  Review of Systems  Positive: Wrist pain Negative: Fever, swelling  Physical Exam  BP 115/72 (BP Location: Right Arm)   Pulse 73   Temp 98.9 F (37.2 C)   Resp 16   SpO2 99%  Gen:   Awake, no distress   HEENT:  Atraumatic  Resp:  Normal effort  Cardiac:  Normal rate  Abd:   Nondistended MSK:   Moves extremities without difficulty, tenderness over right wrist without swelling or deformity, no redness or warmth, pain worse with range of motion, 2+ radial pulse Neuro:  Speech clear   Medical Decision Making  Medically screening exam initiated at 2:33 PM.  Appropriate orders placed.  Lawrence Schneider was informed that the remainder of the evaluation will be completed by another provider, this initial triage assessment does not replace that evaluation, and the importance of remaining in the ED until their evaluation is complete.  Clinical Impression  1.  Right wrist pain   Lawrence Schneider, [place] 02/17/21 1436

## 2021-02-17 NOTE — Discharge Instructions (Addendum)
Take ibuprofen 400 mg 3 times a day with meals for pain.  Use the wrist splint if needed for pain.  Follow-up with the doctor of your choice as needed for problems

## 2021-04-26 ENCOUNTER — Encounter: Payer: Medicaid Other | Admitting: Nurse Practitioner

## 2021-11-16 ENCOUNTER — Ambulatory Visit: Payer: Self-pay | Admitting: Nurse Practitioner

## 2021-11-28 ENCOUNTER — Encounter: Payer: Self-pay | Admitting: Emergency Medicine

## 2021-11-28 ENCOUNTER — Telehealth: Payer: BC Managed Care – PPO | Admitting: Emergency Medicine

## 2021-11-28 DIAGNOSIS — R0602 Shortness of breath: Secondary | ICD-10-CM

## 2021-11-28 DIAGNOSIS — R21 Rash and other nonspecific skin eruption: Secondary | ICD-10-CM

## 2021-11-28 NOTE — Progress Notes (Signed)
°  Based on what you shared with me, I feel your condition warrants further evaluation and I recommend that you be seen in a face to face visit. °  °NOTE: There will be NO CHARGE for this eVisit °  °If you are having a true medical emergency please call 911.   °  ° For an urgent face to face visit, [facility] has six urgent care centers for your convenience:  °  ° [facility] [place] °Get Driving Directions °[phone] °[address] °[place], [place] [address] °  ° [facility] ([place]) °Get Driving Directions °[phone] °[address] °[place], [place] [address] ° °[facility] ([place] [facility] [place]) °Get Driving Directions °[phone] °[address] °[place],  [place]  [address] ° °[facility] °Get Driving Directions °[phone] °[address] °[place], [place] [address] °  °[facility] [place] °Get Driving Directions  °[phone] °[address] °[address] °[place], [place] [address] °  °[facility] [place] °Get Driving Directions °[phone] °[address] °[place], [place] [address] ° °Your MyChart E-visit questionnaire answers were reviewed by a board certified advanced clinical practitioner to complete your personal care plan based on your specific symptoms.  Thank you for using e-Visits. °

## 2021-11-29 ENCOUNTER — Encounter (HOSPITAL_COMMUNITY): Payer: Self-pay

## 2021-11-29 ENCOUNTER — Ambulatory Visit (HOSPITAL_COMMUNITY)
Admission: RE | Admit: 2021-11-29 | Discharge: 2021-11-29 | Disposition: A | Discharge status: Home / Self Care | Payer: BC Managed Care – PPO | Source: Ambulatory Visit | Attending: Internal Medicine | Admitting: Internal Medicine

## 2021-11-29 ENCOUNTER — Other Ambulatory Visit: Payer: Self-pay

## 2021-11-29 VITALS — BP 123/98 | HR 77 | Temp 98.1°F | Resp 18

## 2021-11-29 DIAGNOSIS — K219 Gastro-esophageal reflux disease without esophagitis: Secondary | ICD-10-CM | POA: Diagnosis present

## 2021-11-29 DIAGNOSIS — R21 Rash and other nonspecific skin eruption: Secondary | ICD-10-CM | POA: Insufficient documentation

## 2021-11-29 LAB — HIV ANTIBODY (ROUTINE TESTING W REFLEX): HIV Screen 4th Generation wRfx: NONREACTIVE

## 2021-11-29 MED ORDER — OMEPRAZOLE 20 MG PO CPDR: 20.0000 mg | DELAYED_RELEASE_CAPSULE | Freq: Every day | ORAL | 0 refills | Status: DC

## 2021-11-29 MED ORDER — VALACYCLOVIR HCL 1 G PO TABS: 1000.0000 mg | ORAL_TABLET | Freq: Every day | ORAL | 0 refills | Status: AC

## 2021-11-29 NOTE — Discharge Instructions (Addendum)
Please take medications as prescribed We will call you with recommendations if labs are abnormal Safe sex practices advised Return to urgent care if symptoms worsen. Please avoid sexual intercourse until lab results are available.

## 2021-11-29 NOTE — ED Triage Notes (Signed)
Pt presents recurrent penile lesions that causes some discomfort.

## 2021-11-30 LAB — CYTOLOGY, (ORAL, ANAL, URETHRAL) ANCILLARY ONLY
Chlamydia: NEGATIVE
Comment: NEGATIVE
Comment: NEGATIVE
Comment: NORMAL
Neisseria Gonorrhea: NEGATIVE
Trichomonas: NEGATIVE

## 2021-11-30 LAB — RPR: RPR Ser Ql: NONREACTIVE

## 2021-12-01 NOTE — ED Provider Notes (Addendum)
[facility]    CSN: [id number] Arrival date & time: 11/29/21  1526      History   Chief Complaint Chief Complaint  Patient presents with   APPOINTMENT: Penile Lesions     HPI Lawrence Schneider is a 21 y.o. male to the urgent care with recurrent painful penile lesions.  Patient noticed this lesions a few days ago.  He denies any penile discharge or dysuria.  Patient is sexually active and engages in unprotected sexual intercourse.  No scrotal or groin pain or swelling.  No fever or chills.  Patient also complains of epigastric abdominal pain after eating.  He has a history of gastroesophageal reflux disease previously managed on the PPI.  Patient has not taken the medication for several months.  No nausea or vomiting.  No change in bowel movement.  No melanotic stool.   HPI  Past Medical History:  Diagnosis Date   GERD (gastroesophageal reflux disease)     Patient Active Problem List   Diagnosis Date Noted   Gastroesophageal reflux disease without esophagitis 11/25/2020    History reviewed. No pertinent surgical history.     Home Medications    Prior to Admission medications   Medication Sig Start Date End Date Taking? Authorizing Provider  valACYclovir (VALTREX) 1000 MG tablet Take 1 tablet (1,000 mg total) by mouth daily for 5 days. 11/29/21 12/04/21 Yes Jamelle Noy, Britta Mccreedy, MD  omeprazole (PRILOSEC) 20 MG capsule Take 1 capsule (20 mg total) by mouth daily. 11/29/21   Aariana Shankland, Britta Mccreedy, MD    Family History Family History  Problem Relation Age of Onset   Hypertension Mother    Healthy Father    Hypertension Maternal Grandmother    Healthy Paternal Grandmother    Healthy Paternal Grandfather     Social History Social History   Tobacco Use   Smoking status: Never   Smokeless tobacco: Never  Vaping Use   Vaping Use: Every day  Substance Use Topics   Alcohol use: Yes    Comment: from time to ttime   Drug use: Never     Allergies   Patient has no  known allergies.   Review of Systems Review of Systems  Skin:  Positive for rash. Negative for color change.    Physical Exam Triage Vital Signs ED Triage Vitals  Enc Vitals Group     BP 11/29/21 1628 (!) 123/98     Pulse Rate 11/29/21 1628 77     Resp 11/29/21 1628 18     Temp 11/29/21 1628 98.1 F (36.7 C)     Temp Source 11/29/21 1628 Oral     SpO2 11/29/21 1628 99 %     Weight --      Height --      Head Circumference --      Peak Flow --      Pain Score 11/29/21 1627 3     Pain Loc --      Pain Edu? --      Excl. in GC? --    No data found.  Updated Vital Signs BP (!) 123/98 (BP Location: Left Arm)    Pulse 77    Temp 98.1 F (36.7 C) (Oral)    Resp 18    SpO2 99%   Visual Acuity Right Eye Distance:   Left Eye Distance:   Bilateral Distance:    Right Eye Near:   Left Eye Near:    Bilateral Near:     Physical Exam  Vitals and nursing note reviewed.  Constitutional:      Appearance: Normal appearance.  Cardiovascular:     Rate and Rhythm: Normal rate and regular rhythm.  Genitourinary:    Comments: Vesicular rash noted on the shaft of the penis.  No erythema.  No scrotal swelling, groin swelling. Neurological:     Mental Status: He is alert.     UC Treatments / Results  Labs (all labs ordered are listed, but only abnormal results are displayed) Labs Reviewed  HIV ANTIBODY (ROUTINE TESTING W REFLEX)  RPR  HERPES SIMPLEX VIRUS(HSV) DNA BY PCR  CYTOLOGY, (ORAL, ANAL, URETHRAL) ANCILLARY ONLY    EKG   Radiology No results found.  Procedures Procedures (including critical care time)  Medications Ordered in UC Medications - No data to display  Initial Impression / Assessment and Plan / UC Course  I have reviewed the triage vital signs and the nursing notes.  Pertinent labs & imaging results that were available during my care of the patient were reviewed by me and considered in my medical decision making (see chart for details).     1.   Penile rash: Cytology for GC/chlamydia/trichomonas HSV PCR has been sent Valacyclovir 1000 mg orally daily for 5 days Safe sex practices advised We will call patient with recommendations if labs are abnormal Patient is advised to abstain from sexual intercourse until lab results are available Return precautions given.  2.  Gastroesophageal reflux disease Omeprazole 20 mg orally daily Return precautions given. Final Clinical Impressions(s) / UC Diagnoses   Final diagnoses:  Penile rash     Discharge Instructions      Please take medications as prescribed We will call you with recommendations if labs are abnormal Safe sex practices advised Return to urgent care if symptoms worsen. Please avoid sexual intercourse until lab results are available.   ED Prescriptions     Medication Sig Dispense Auth. Provider   omeprazole (PRILOSEC) 20 MG capsule Take 1 capsule (20 mg total) by mouth daily. 30 capsule Charletta Voight, Britta Mccreedy, MD   valACYclovir (VALTREX) 1000 MG tablet Take 1 tablet (1,000 mg total) by mouth daily for 5 days. 5 tablet Kataryna Mcquilkin, Britta Mccreedy, MD      PDMP not reviewed this encounter.   Merrilee Jansky, MD 12/01/21 2121    Merrilee Jansky, MD 12/01/21 2123

## 2021-12-21 ENCOUNTER — Encounter: Payer: Self-pay | Admitting: Nurse Practitioner

## 2021-12-21 NOTE — Progress Notes (Signed)
°  I,Matika Bartell T Heidi Lemay,acting as a Neurosurgeon for Arnette Felts, FNP.,have documented all relevant documentation on the behalf of Arnette Felts, FNP,as directed by  Arnette Felts, FNP while in the presence of Arnette Felts, FNP.  This visit occurred during the SARS-CoV-2 public health emergency.  Safety protocols were in place, including screening questions prior to the visit, additional usage of staff PPE, and extensive cleaning of exam room while observing appropriate contact time as indicated for disinfecting solutions.  Subjective:     Patient ID: Lawrence Schneider , male    DOB: Mar 03, 2001 , 21 y.o.   MRN: [id number]   Chief Complaint  Patient presents with   Rash    HPI  Pt presents for rash.     Past Medical History:  Diagnosis Date   GERD (gastroesophageal reflux disease)      Family History  Problem Relation Age of Onset   Hypertension Mother    Healthy Father    Hypertension Maternal Grandmother    Healthy Paternal Grandmother    Healthy Paternal Grandfather      Current Outpatient Medications:    omeprazole (PRILOSEC) 20 MG capsule, Take 1 capsule (20 mg total) by mouth daily., Disp: 30 capsule, Rfl: 0   No Known Allergies   Review of Systems   There were no vitals filed for this visit. There is no height or weight on file to calculate BMI.   Objective:  Physical Exam      Assessment And Plan:     There are no diagnoses linked to this encounter.    Patient was given opportunity to ask questions. Patient verbalized understanding of the plan and was able to repeat key elements of the plan. All questions were answered to their satisfaction.  Coolidge Breeze, CMA   I, Coolidge Breeze, CMA, have reviewed all documentation for this visit. The documentation on 12/21/21 for the exam, diagnosis, procedures, and orders are all accurate and complete.   IF YOU HAVE BEEN REFERRED TO A SPECIALIST, IT MAY TAKE 1-2 WEEKS TO SCHEDULE/PROCESS THE REFERRAL. IF YOU HAVE NOT  HEARD FROM US/SPECIALIST IN TWO WEEKS, PLEASE GIVE [place] A CALL AT [phone] X [phone].   THE PATIENT IS ENCOURAGED TO PRACTICE SOCIAL DISTANCING DUE TO THE COVID-19 PANDEMIC.

## 2022-02-06 ENCOUNTER — Encounter (HOSPITAL_COMMUNITY): Payer: Self-pay | Admitting: Emergency Medicine

## 2022-02-06 ENCOUNTER — Emergency Department (HOSPITAL_COMMUNITY): Payer: BC Managed Care – PPO

## 2022-02-06 ENCOUNTER — Other Ambulatory Visit: Payer: Self-pay

## 2022-02-06 ENCOUNTER — Emergency Department (HOSPITAL_COMMUNITY)
Admission: EM | Admit: 2022-02-06 | Discharge: 2022-02-06 | Disposition: A | Discharge status: Home / Self Care | Payer: BC Managed Care – PPO | Attending: Emergency Medicine | Admitting: Emergency Medicine

## 2022-02-06 DIAGNOSIS — R059 Cough, unspecified: Secondary | ICD-10-CM | POA: Diagnosis present

## 2022-02-06 DIAGNOSIS — J189 Pneumonia, unspecified organism: Secondary | ICD-10-CM | POA: Insufficient documentation

## 2022-02-06 DIAGNOSIS — F17290 Nicotine dependence, other tobacco product, uncomplicated: Secondary | ICD-10-CM | POA: Diagnosis not present

## 2022-02-06 MED ORDER — AZITHROMYCIN 250 MG PO TABS: 250.0000 mg | ORAL_TABLET | Freq: Every day | ORAL | 0 refills | Status: DC

## 2022-02-06 NOTE — ED Notes (Signed)
E-signature pad unavailable at time of pt discharge. This RN discussed discharge materials with pt and answered all pt questions. Pt stated understanding of discharge material. ? ?

## 2022-02-06 NOTE — Discharge Instructions (Addendum)
Your chest x-ray shows a small area of pneumonia.  Please take antibiotics as prescribed for the next 5 days.  You can use over-the-counter cough medication such Mucinex as needed and Motrin and Tylenol for any pain.  Please make sure you take entire course of antibiotics even if your symptoms resolve. ? ?If you develop fevers, worsening pain, shortness of breath or other new or concerning symptoms, please return for reevaluation. ?

## 2022-02-06 NOTE — ED Triage Notes (Signed)
Pt c/o cough since smoking hookah Friday night.  ?

## 2022-02-06 NOTE — ED Provider Notes (Signed)
?[facility] EMERGENCY DEPARTMENT ?Provider Note ? ? ?CSN: [id number] ?Arrival date & time: 02/06/22  2110 ? ?  ? ?History ? ?Chief Complaint  ?Patient presents with  ? Cough  ? ? ?Lawrence Schneider is a 21 y.o. male. ? ?Lawrence Schneider is a 21 y.o. male with a history of GERD, otherwise healthy, who presents to the emergency department for evaluation of cough.  Patient reports he has had coughing since Friday night after smoking hookah.  He reports that he is also noted some intermittent mild discomfort on the right side of his chest worse with coughing and occasionally worse with deep breath but not persistent, no chest pain otherwise.  He reports cough is occasionally productive.  No fevers or chills.  No known sick contacts.  No associated rhinorrhea, congestion, abdominal pain, nausea or vomiting.  He reports that aside from smoking hookah on Friday he does not smoke, vape or use marijuana.  No other aggravating or alleviating factors. ? ?The history is provided by the patient.  ? ?  ? ?Home Medications ?Prior to Admission medications   ?Medication Sig Start Date End Date Taking? Authorizing Provider  ?omeprazole (PRILOSEC) 20 MG capsule Take 1 capsule (20 mg total) by mouth daily. 11/29/21   Lamptey, Britta Mccreedy, MD  ?   ? ?Allergies    ?Patient has no known allergies.   ? ?Review of Systems   ?Review of Systems  ?Constitutional:  Negative for chills and fever.  ?HENT: Negative.    ?Respiratory:  Positive for cough. Negative for chest tightness and shortness of breath.   ?Cardiovascular:  Negative for chest pain.  ?Gastrointestinal:  Negative for abdominal pain, nausea and vomiting.  ?Genitourinary:  Negative for dysuria and frequency.  ? ?Physical Exam ?Updated Vital Signs ?BP (!) 126/91 (BP Location: Right Arm)   Pulse 87   Temp 98.5 ?F (36.9 ?C) (Oral)   Resp 17   Ht 5\' 5"  (1.651 m)   Wt 65 kg   SpO2 100%   BMI 23.85 kg/m?  ?Physical Exam ?Vitals and nursing note reviewed.  ?Constitutional:   ?    General: He is not in acute distress. ?   Appearance: Normal appearance. He is well-developed. He is not ill-appearing or diaphoretic.  ?HENT:  ?   Head: Normocephalic and atraumatic.  ?   Nose: No rhinorrhea.  ?   Mouth/Throat:  ?   Mouth: Mucous membranes are moist.  ?   Pharynx: Oropharynx is clear.  ?Eyes:  ?   General:     ?   Right eye: No discharge.     ?   Left eye: No discharge.  ?Cardiovascular:  ?   Rate and Rhythm: Normal rate and regular rhythm.  ?   Heart sounds: Normal heart sounds.  ?Pulmonary:  ?   Effort: Pulmonary effort is normal. No respiratory distress.  ?   Breath sounds: Normal breath sounds. No wheezing, rhonchi or rales.  ?Abdominal:  ?   General: Bowel sounds are normal.  ?   Palpations: Abdomen is soft.  ?   Tenderness: There is no abdominal tenderness.  ?Musculoskeletal:     ?   General: No deformity.  ?   Cervical back: Neck supple.  ?Skin: ?   General: Skin is warm and dry.  ?Neurological:  ?   Mental Status: He is alert and oriented to person, place, and time.  ?   Coordination: Coordination normal.  ?Psychiatric:     ?  Mood and Affect: Mood normal.     ?   Behavior: Behavior normal.  ? ? ?ED Results / Procedures / Treatments   ?Labs ?(all labs ordered are listed, but only abnormal results are displayed) ?Labs Reviewed - No data to display ? ?EKG ?None ? ?Radiology ?DG Chest 2 View ? ?Result Date: 02/06/2022 ?CLINICAL DATA:  Cough EXAM: CHEST - 2 VIEW COMPARISON:  08/19/2020 FINDINGS: Right upper lobe opacities suspicious for pneumonia. Normal cardiac size. No pleural effusion or pneumothorax IMPRESSION: Mild right upper lobe pneumonia Electronically Signed   By: Jasmine Pang M.D.   On: 02/06/2022 21:35   ? ?Procedures ?Procedures  ? ? ?Medications Ordered in ED ?Medications - No data to display ? ?ED Course/ Medical Decision Making/ A&P ?  ?                        ?This patient presents to the ED for concern of cough, this involves an extensive number of treatment options, and is a  complaint that carries with it a high risk of complications and morbidity.  The differential diagnosis includes viral upper respiratory infection, pneumonia, COVID, flu, bronchitis, GERD ? ? ? ?Additional history obtained: ? ?Additional history obtained from chart review ?External records from outside source obtained and reviewed including prior ED and urgent care visits ? ? ?Imaging Studies ordered: ? ?I ordered imaging studies including chest x-ray ?I independently visualized and interpreted imaging which showed right middle lobe pneumonia ?I agree with the radiologist interpretation ? ? ? ?Problem List / ED Course / Critical interventions / Medication management ? ?Patient with persistent cough for the past 3 days after smoking hookah, no fevers or other associated symptoms and patient is well-appearing.  On chest x-ray he has a mild right middle lobe pneumonia, and this is the area where he has had some intermittent mild discomfort as well. ?I ordered medication including azithromycin for pneumonia ?Patient with no hypoxia or tachypnea and clear lungs, feel he is appropriate for outpatient treatment of pneumonia with azithromycin, return precautions provided.  Patient discharged home in good condition, no further emergent evaluation or treatment indicated. ? ? ? ?Test / Admission - Considered: ? ?Admission considered but based upon patient's reassuring vitals and no comorbidities feel he is appropriate for outpatient treatment for pneumonia. ? ? ? ? ? ? ? ? ?Final Clinical Impression(s) / ED Diagnoses ?Final diagnoses:  ?Community acquired pneumonia of right middle lobe of lung  ? ? ?Rx / DC Orders ?ED Discharge Orders   ? ?      Ordered  ?  azithromycin (ZITHROMAX) 250 MG tablet  Daily       ? 02/06/22 2235  ? ?  ?  ? ?  ? ? ?  ?Dartha Lodge, PA-C ?02/06/22 2357 ? ?  ?Terrilee Files, MD ?02/07/22 1000 ? ?

## 2022-02-22 IMAGING — CR DG WRIST COMPLETE 3+V*R*
4 series · 4 of 4 positions shown · non-contrast
Comparison: None.

CLINICAL DATA: 20-year-old male with right wrist pain.

EXAM:
RIGHT WRIST - COMPLETE 3+ VIEW

[wrist pa]
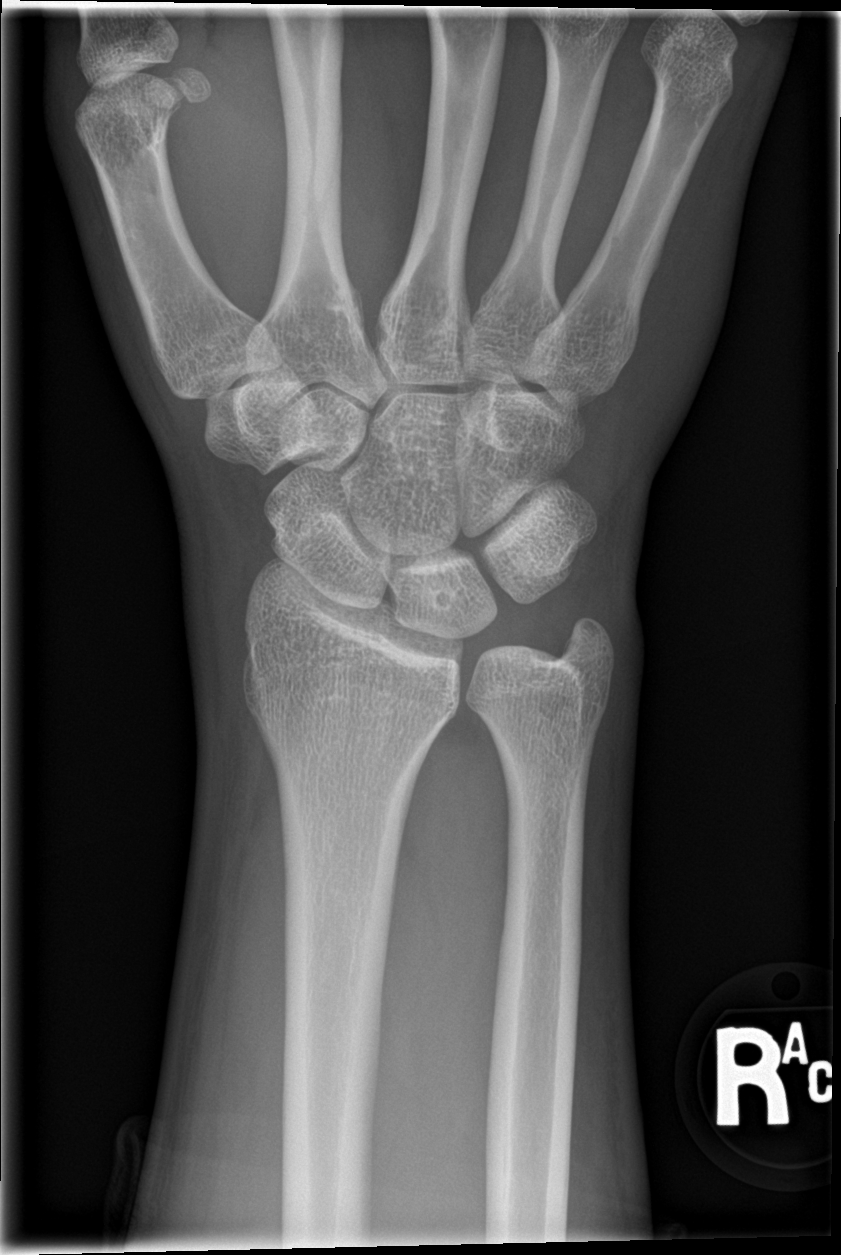

[wrist obl]
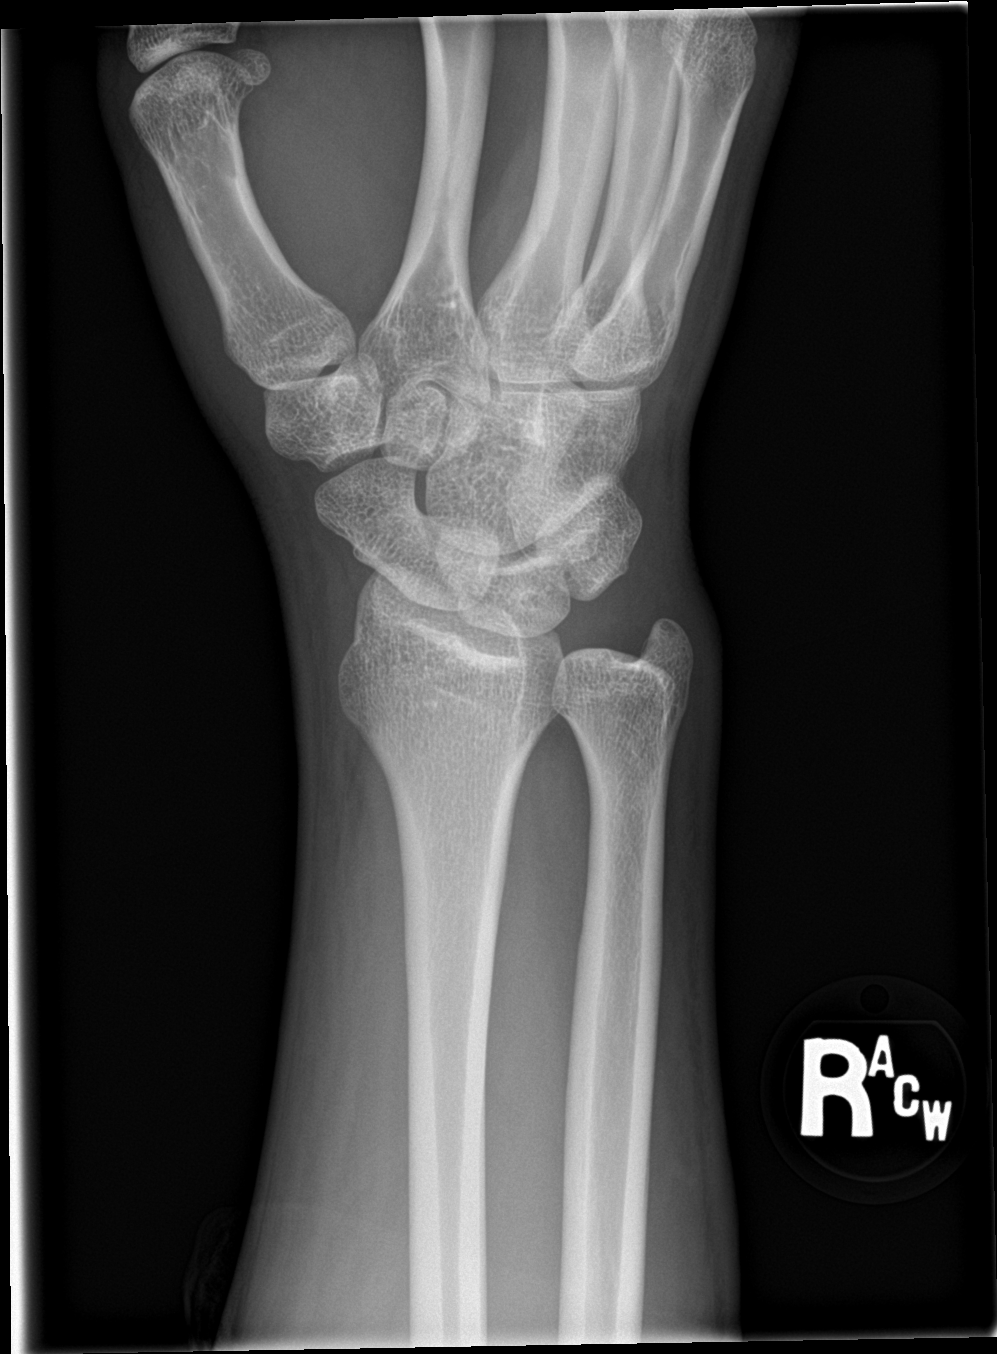

[wrist lat]
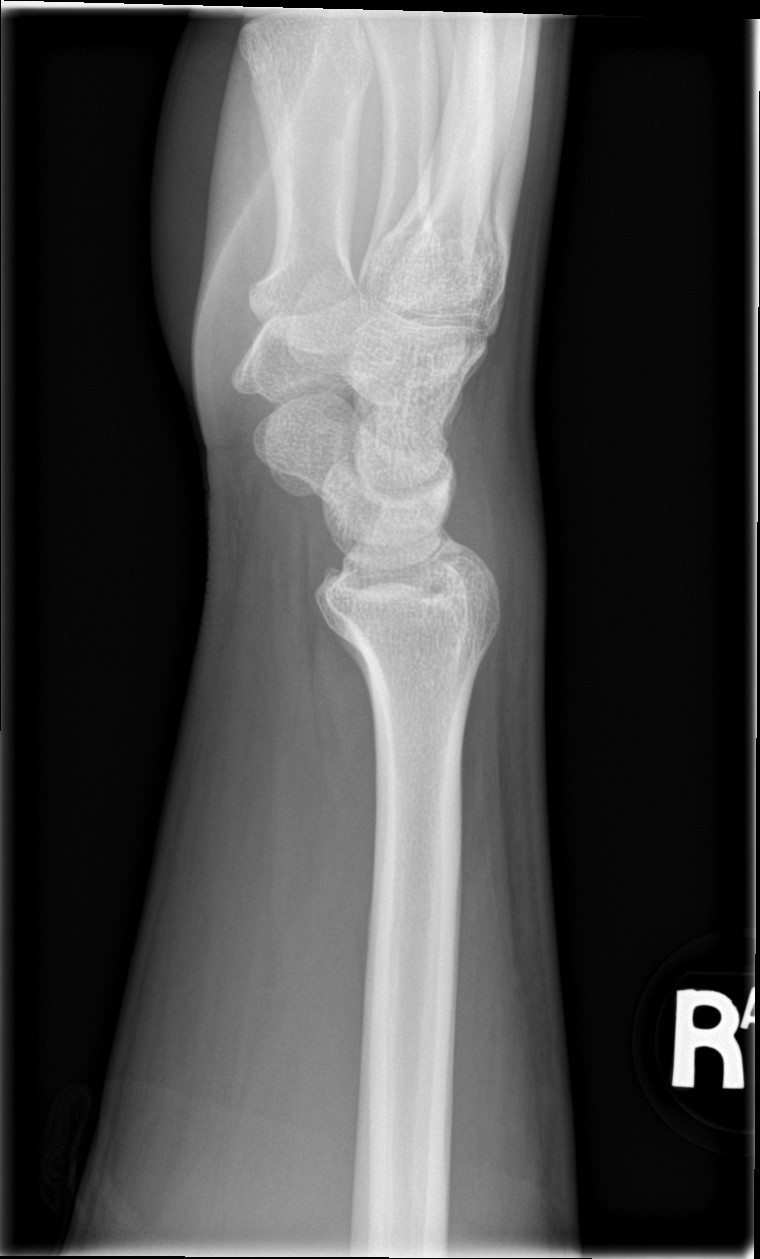

[wrist navicular]
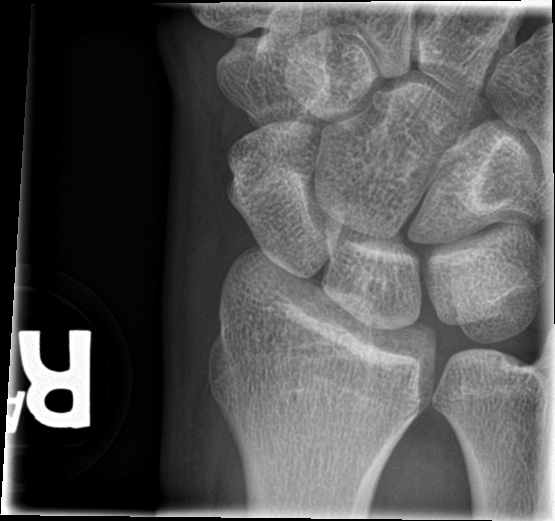

[4 of 4 positions shown; findings below may reference images not displayed]

FINDINGS: There is no evidence of fracture or dislocation. There is no
evidence of arthropathy or other focal bone abnormality. Soft
tissues are unremarkable.
IMPRESSION: Negative.

## 2022-08-09 ENCOUNTER — Ambulatory Visit (HOSPITAL_COMMUNITY)
Admission: EM | Admit: 2022-08-09 | Discharge: 2022-08-09 | Disposition: A | Discharge status: Home / Self Care | Payer: BC Managed Care – PPO | Attending: Family Medicine | Admitting: Family Medicine

## 2022-08-09 ENCOUNTER — Encounter (HOSPITAL_COMMUNITY): Payer: Self-pay | Admitting: *Deleted

## 2022-08-09 DIAGNOSIS — K529 Noninfective gastroenteritis and colitis, unspecified: Secondary | ICD-10-CM

## 2022-08-09 NOTE — ED Triage Notes (Signed)
Pt states he had vomiting and diarrhea x 4 days he was staying home and needs a MD to return to work. He needs note to say he had a stomach virus.

## 2022-08-09 NOTE — ED Provider Notes (Signed)
[place]    CSN: [id number] Arrival date & time: 08/09/22  1129      History   Chief Complaint Chief Complaint  Patient presents with   Emesis   Diarrhea    HPI Lawrence Schneider is a 21 y.o. male.    Emesis Associated symptoms: diarrhea   Diarrhea Associated symptoms: vomiting    Here for nausea, vomiting, and diarrhea.  Symptoms began on October 5 and the last time he had any trouble with vomiting or diarrhea was on October 8.  He never had any fever.  He had vomiting about 3 or 4 times a day during that time he was ill.  He would have loose stools about the same amount of time.  He is now eating and drinking normally.  His stomach is just a little upset but not having any cramps or anything.   He needs a note stating it is okay for him to go back to work and it also needs to have a diagnosis  Past Medical History:  Diagnosis Date   GERD (gastroesophageal reflux disease)     Patient Active Problem List   Diagnosis Date Noted   Gastroesophageal reflux disease without esophagitis 11/25/2020    History reviewed. No pertinent surgical history.     Home Medications    Prior to Admission medications   Not on File    Family History Family History  Problem Relation Age of Onset   Hypertension Mother    Healthy Father    Hypertension Maternal Grandmother    Healthy Paternal Grandmother    Healthy Paternal Grandfather     Social History Social History   Tobacco Use   Smoking status: Never   Smokeless tobacco: Never  Vaping Use   Vaping Use: Every day  Substance Use Topics   Alcohol use: Yes    Comment: from time to ttime   Drug use: Never     Allergies   Patient has no known allergies.   Review of Systems Review of Systems  Gastrointestinal:  Positive for diarrhea and vomiting.     Physical Exam Triage Vital Signs ED Triage Vitals  Enc Vitals Group     BP 08/09/22 1153 134/80     Pulse Rate 08/09/22 1153 84     Resp  08/09/22 1153 18     Temp 08/09/22 1153 98.2 F (36.8 C)     Temp Source 08/09/22 1153 Oral     SpO2 08/09/22 1153 100 %     Weight --      Height --      Head Circumference --      Peak Flow --      Pain Score 08/09/22 1152 0     Pain Loc --      Pain Edu? --      Excl. in [place]? --    No data found.  Updated Vital Signs BP 134/80 (BP Location: Left Arm)   Pulse 84   Temp 98.2 F (36.8 C) (Oral)   Resp 18   SpO2 100%   Visual Acuity Right Eye Distance:   Left Eye Distance:   Bilateral Distance:    Right Eye Near:   Left Eye Near:    Bilateral Near:     Physical Exam Vitals reviewed.  Constitutional:      General: He is not in acute distress.    Appearance: He is not ill-appearing, toxic-appearing or diaphoretic.  HENT:     Mouth/Throat:  Mouth: Mucous membranes are moist.  Eyes:     Extraocular Movements: Extraocular movements intact.     Conjunctiva/sclera: Conjunctivae normal.     Pupils: Pupils are equal, round, and reactive to light.  Cardiovascular:     Rate and Rhythm: Normal rate and regular rhythm.     Heart sounds: No murmur heard. Pulmonary:     Effort: Pulmonary effort is normal.     Breath sounds: Normal breath sounds.  Abdominal:     Palpations: Abdomen is soft.     Tenderness: There is no abdominal tenderness.  Musculoskeletal:     Cervical back: Neck supple.  Lymphadenopathy:     Cervical: No cervical adenopathy.  Skin:    Coloration: Skin is not jaundiced or pale.  Neurological:     Mental Status: He is alert and oriented to person, place, and time.  Psychiatric:        Behavior: Behavior normal.      UC Treatments / Results  Labs (all labs ordered are listed, but only abnormal results are displayed) Labs Reviewed - No data to display  EKG   Radiology No results found.  Procedures Procedures (including critical care time)  Medications Ordered in UC Medications - No data to display  Initial Impression / Assessment  and Plan / UC Course  I have reviewed the triage vital signs and the nursing notes.  Pertinent labs & imaging results that were available during my care of the patient were reviewed by me and considered in my medical decision making (see chart for details).        Note is done for him to return to work this evening Final Clinical Impressions(s) / UC Diagnoses   Final diagnoses:  Gastroenteritis     Discharge Instructions      Eat a bland diet for another 2-3 days     ED Prescriptions   None    PDMP not reviewed this encounter.   Zenia Resides, MD 08/09/22 [phone]

## 2022-08-09 NOTE — Discharge Instructions (Addendum)
Eat a bland diet for another 2-3 days

## 2022-08-24 LAB — HSV DNA BY PCR (REFERENCE LAB)
HSV 1 DNA: NEGATIVE
HSV 2 DNA: POSITIVE — AB

## 2023-02-11 IMAGING — DX DG CHEST 2V
2 series · 2 of 2 positions shown · non-contrast
Comparison: 08/19/2020

CLINICAL DATA: Cough

EXAM:
CHEST - 2 VIEW

[chest pa]
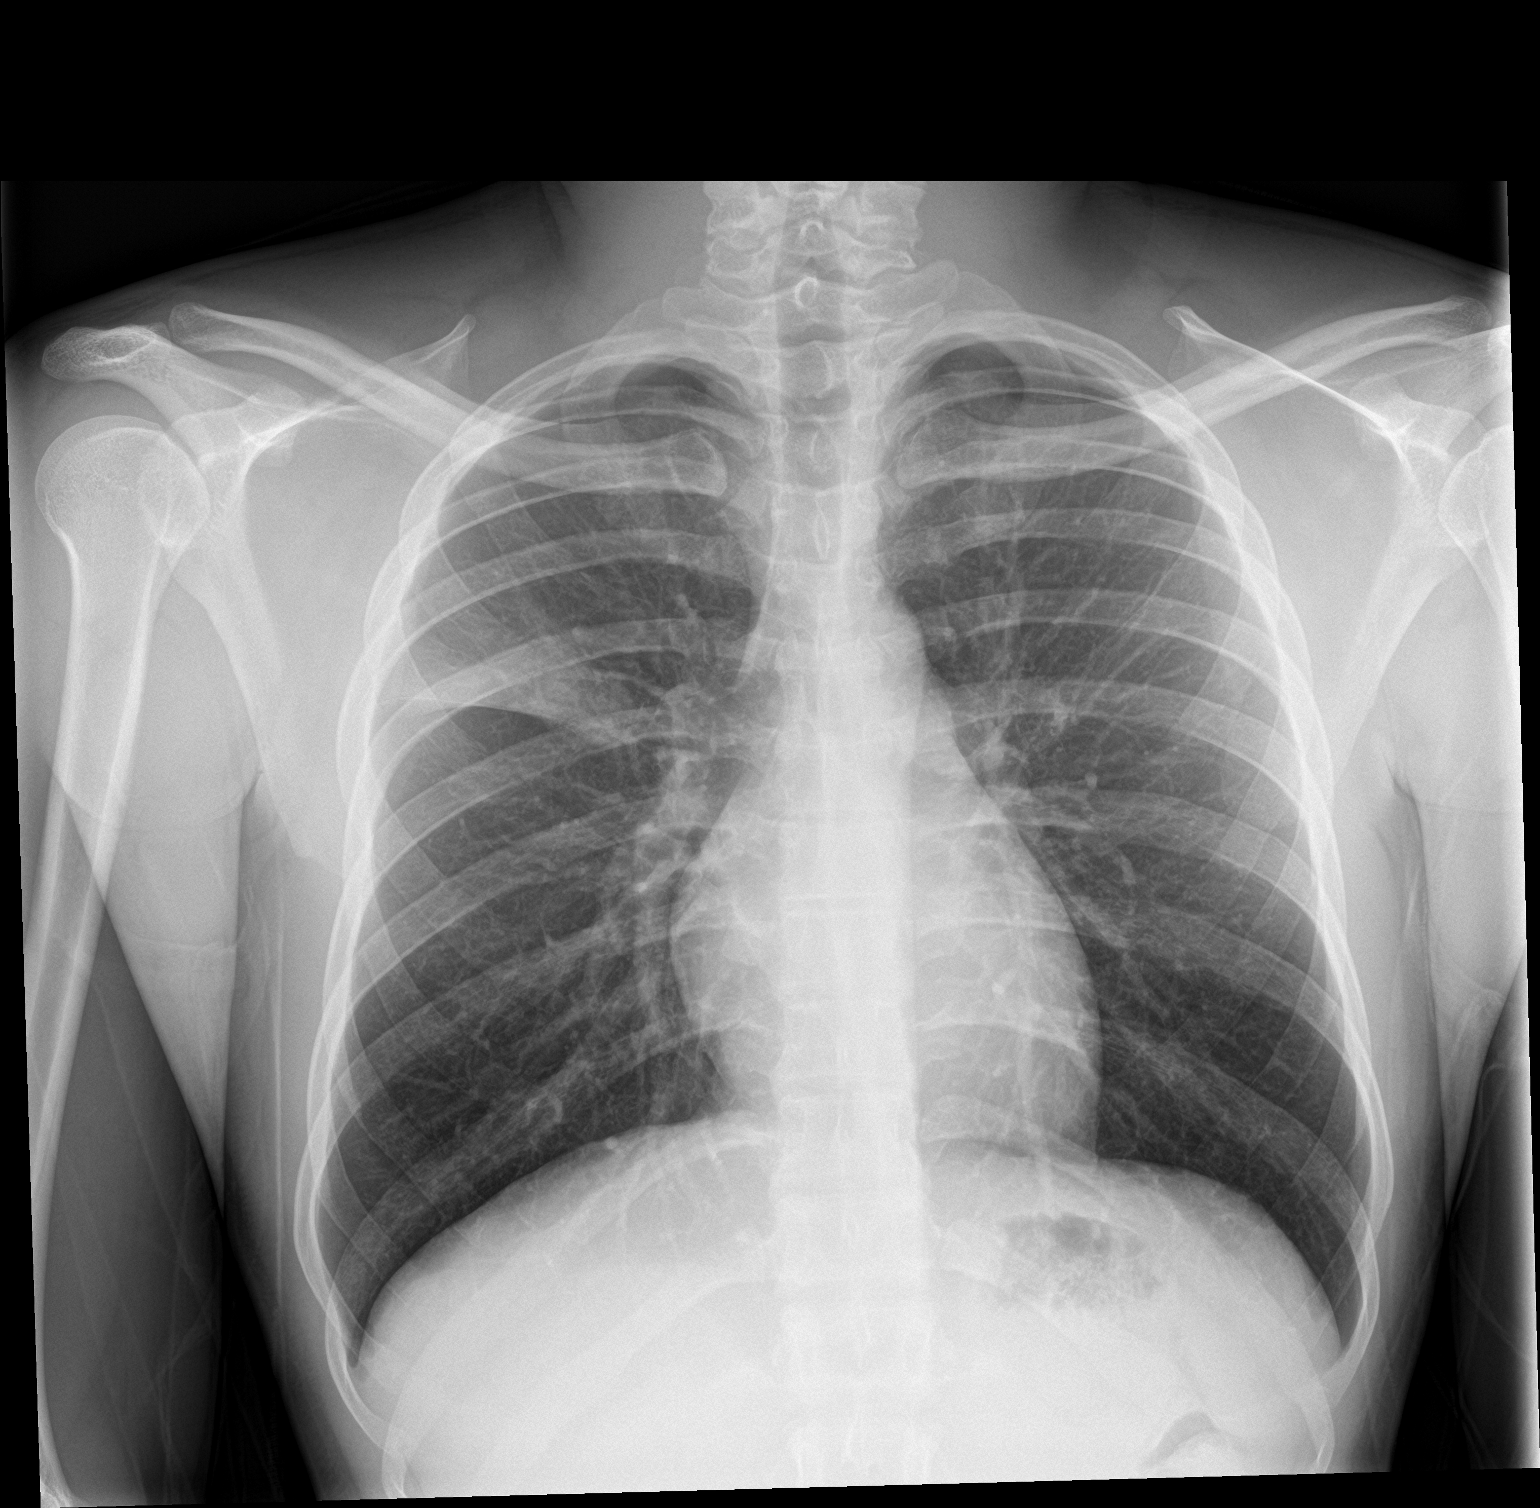

[chest lat]
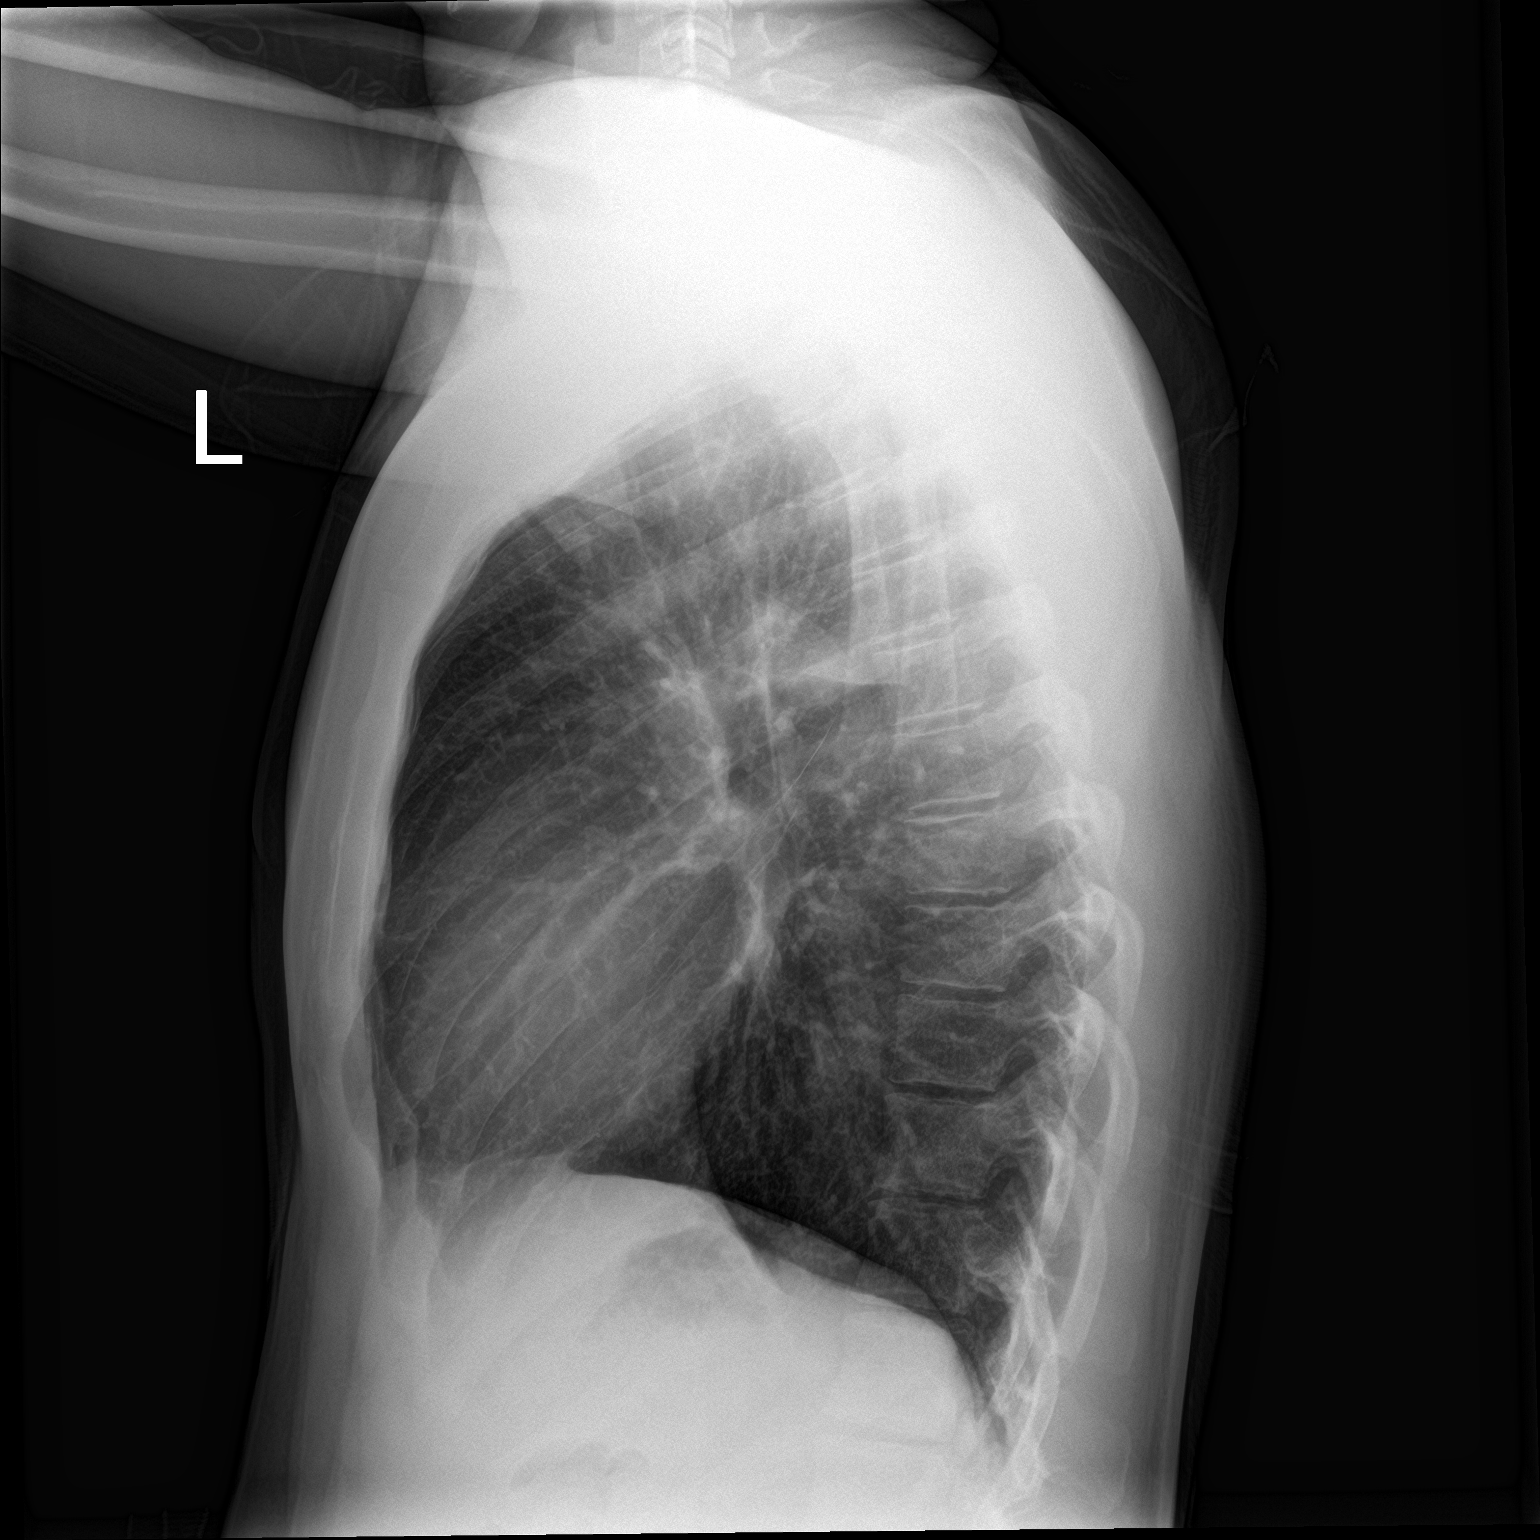

[2 of 2 positions shown; findings below may reference images not displayed]

FINDINGS: Right upper lobe opacities suspicious for pneumonia. Normal cardiac
size. No pleural effusion or pneumothorax
IMPRESSION: Mild right upper lobe pneumonia

## 2023-06-01 DIAGNOSIS — Z419 Encounter for procedure for purposes other than remedying health state, unspecified: Secondary | ICD-10-CM | POA: Diagnosis not present

## 2023-07-02 DIAGNOSIS — Z419 Encounter for procedure for purposes other than remedying health state, unspecified: Secondary | ICD-10-CM | POA: Diagnosis not present

## 2023-08-01 DIAGNOSIS — Z419 Encounter for procedure for purposes other than remedying health state, unspecified: Secondary | ICD-10-CM | POA: Diagnosis not present

## 2023-09-01 DIAGNOSIS — Z419 Encounter for procedure for purposes other than remedying health state, unspecified: Secondary | ICD-10-CM | POA: Diagnosis not present

## 2023-10-01 DIAGNOSIS — Z419 Encounter for procedure for purposes other than remedying health state, unspecified: Secondary | ICD-10-CM | POA: Diagnosis not present

## 2023-10-12 DIAGNOSIS — F919 Conduct disorder, unspecified: Secondary | ICD-10-CM | POA: Diagnosis not present

## 2023-11-01 DIAGNOSIS — Z419 Encounter for procedure for purposes other than remedying health state, unspecified: Secondary | ICD-10-CM | POA: Diagnosis not present

## 2023-12-02 DIAGNOSIS — Z419 Encounter for procedure for purposes other than remedying health state, unspecified: Secondary | ICD-10-CM | POA: Diagnosis not present

## 2023-12-30 DIAGNOSIS — Z419 Encounter for procedure for purposes other than remedying health state, unspecified: Secondary | ICD-10-CM | POA: Diagnosis not present

## 2024-02-10 DIAGNOSIS — Z419 Encounter for procedure for purposes other than remedying health state, unspecified: Secondary | ICD-10-CM | POA: Diagnosis not present

## 2024-03-11 DIAGNOSIS — Z419 Encounter for procedure for purposes other than remedying health state, unspecified: Secondary | ICD-10-CM | POA: Diagnosis not present

## 2024-04-11 DIAGNOSIS — Z419 Encounter for procedure for purposes other than remedying health state, unspecified: Secondary | ICD-10-CM | POA: Diagnosis not present

## 2024-05-11 DIAGNOSIS — Z419 Encounter for procedure for purposes other than remedying health state, unspecified: Secondary | ICD-10-CM | POA: Diagnosis not present

## 2024-06-11 DIAGNOSIS — Z419 Encounter for procedure for purposes other than remedying health state, unspecified: Secondary | ICD-10-CM | POA: Diagnosis not present

## 2024-07-12 DIAGNOSIS — Z419 Encounter for procedure for purposes other than remedying health state, unspecified: Secondary | ICD-10-CM | POA: Diagnosis not present

## 2024-10-11 DIAGNOSIS — Z419 Encounter for procedure for purposes other than remedying health state, unspecified: Secondary | ICD-10-CM | POA: Diagnosis not present

## 2025-01-15 ENCOUNTER — Ambulatory Visit: Admitting: Family Medicine
